# Patient Record
Sex: Male | Born: 1960
Health system: Southern US, Community
[De-identification: ages and names within clinical notes are randomized; demographics above are authoritative.]

## PROBLEM LIST (undated history)

## (undated) DIAGNOSIS — J449 Chronic obstructive pulmonary disease, unspecified: Secondary | ICD-10-CM

## (undated) DIAGNOSIS — I10 Essential (primary) hypertension: Secondary | ICD-10-CM

---

## 1998-11-25 ENCOUNTER — Emergency Department (HOSPITAL_COMMUNITY): Admission: EM | Admit: 1998-11-25 | Discharge: 1998-11-25 | Payer: Self-pay | Admitting: Emergency Medicine

## 2003-05-12 ENCOUNTER — Emergency Department (HOSPITAL_COMMUNITY): Admission: EM | Admit: 2003-05-12 | Discharge: 2003-05-13 | Payer: Self-pay | Admitting: *Deleted

## 2003-05-12 ENCOUNTER — Encounter: Payer: Self-pay | Admitting: *Deleted

## 2010-12-13 ENCOUNTER — Emergency Department (HOSPITAL_COMMUNITY)
Admission: EM | Admit: 2010-12-13 | Discharge: 2010-12-13 | Payer: Self-pay | Source: Home / Self Care | Admitting: Emergency Medicine

## 2011-04-09 ENCOUNTER — Emergency Department (HOSPITAL_COMMUNITY): Payer: Self-pay

## 2011-04-09 ENCOUNTER — Emergency Department (HOSPITAL_COMMUNITY)
Admission: EM | Admit: 2011-04-09 | Discharge: 2011-04-09 | Disposition: A | Discharge status: Home / Self Care | Payer: Self-pay | Attending: Emergency Medicine | Admitting: Emergency Medicine

## 2011-04-09 DIAGNOSIS — M19079 Primary osteoarthritis, unspecified ankle and foot: Secondary | ICD-10-CM | POA: Insufficient documentation

## 2011-04-09 DIAGNOSIS — Z79899 Other long term (current) drug therapy: Secondary | ICD-10-CM | POA: Insufficient documentation

## 2011-06-13 ENCOUNTER — Emergency Department (HOSPITAL_COMMUNITY)
Admission: EM | Admit: 2011-06-13 | Discharge: 2011-06-14 | Disposition: A | Discharge status: Home / Self Care | Payer: Self-pay | Attending: Emergency Medicine | Admitting: Emergency Medicine

## 2011-06-13 ENCOUNTER — Encounter: Payer: Self-pay | Admitting: *Deleted

## 2011-06-13 DIAGNOSIS — M722 Plantar fascial fibromatosis: Secondary | ICD-10-CM | POA: Insufficient documentation

## 2011-06-13 DIAGNOSIS — S93409A Sprain of unspecified ligament of unspecified ankle, initial encounter: Secondary | ICD-10-CM | POA: Insufficient documentation

## 2011-06-13 DIAGNOSIS — F17200 Nicotine dependence, unspecified, uncomplicated: Secondary | ICD-10-CM | POA: Insufficient documentation

## 2011-06-13 DIAGNOSIS — X58XXXA Exposure to other specified factors, initial encounter: Secondary | ICD-10-CM | POA: Insufficient documentation

## 2011-06-13 DIAGNOSIS — M79609 Pain in unspecified limb: Secondary | ICD-10-CM | POA: Insufficient documentation

## 2011-06-13 MED ORDER — PREDNISONE 20 MG PO TABS
60.0000 mg | ORAL_TABLET | Freq: Once | ORAL | Status: AC
  Administered 2011-06-13: 60 mg via ORAL
  Filled 2011-06-13: qty 3

## 2011-06-13 NOTE — ED Notes (Signed)
Pt requesting tylenol for pain

## 2011-06-13 NOTE — ED Notes (Signed)
Pt c/o left foot pain x 1 month.

## 2011-06-14 DIAGNOSIS — M722 Plantar fascial fibromatosis: Secondary | ICD-10-CM

## 2011-06-14 NOTE — ED Notes (Signed)
Pt ready for discharge

## 2011-06-14 NOTE — ED Notes (Signed)
Pt waiting for d/c papers 

## 2011-06-14 NOTE — ED Provider Notes (Signed)
History     Chief Complaint  Patient presents with  . Foot Pain   Patient is a 50 y.o. male presenting with lower extremity pain.  Foot Pain This is a new problem. Episode onset: ~ 2 weeks ago. The problem has been gradually worsening. The symptoms are aggravated by standing and walking. He has tried acetaminophen for the symptoms.  Foot Pain This is a new problem. Episode onset: ~ 2 weeks ago. The problem has been gradually worsening. The symptoms are aggravated by standing and walking. He has tried acetaminophen for the symptoms.    History reviewed. No pertinent past medical history.  History reviewed. No pertinent past surgical history.  History reviewed. No pertinent family history.  History  Substance Use Topics  . Smoking status: Current Some Day Smoker  . Smokeless tobacco: Not on file  . Alcohol Use: No      Review of Systems  Musculoskeletal:       Pain below L lateral malleolus.  Pain to plantar L foot.  All other systems reviewed and are negative.    Physical Exam  BP 170/89  Pulse 68  Temp(Src) 97.7 F (36.5 C) (Oral)  Resp 20  Ht 5\' 6"  (1.676 m)  Wt 170 lb (77.111 kg)  BMI 27.44 kg/m2  SpO2 98%  Physical Exam  Nursing note and vitals reviewed. Constitutional: He is oriented to person, place, and time. Vital signs are normal. He appears well-developed and well-nourished.  HENT:  Head: Normocephalic and atraumatic.  Right Ear: External ear normal.  Left Ear: External ear normal.  Nose: Nose normal.  Mouth/Throat: No oropharyngeal exudate.  Eyes: Conjunctivae and EOM are normal. Pupils are equal, round, and reactive to light. Right eye exhibits no discharge. Left eye exhibits no discharge. No scleral icterus.  Neck: Normal range of motion. Neck supple. No JVD present. No tracheal deviation present. No thyromegaly present.  Cardiovascular: Normal rate, regular rhythm, normal heart sounds, intact distal pulses and normal pulses.  Exam reveals no  gallop and no friction rub.   No murmur heard. Pulmonary/Chest: Effort normal and breath sounds normal. No stridor. No respiratory distress. He has no wheezes. He has no rales. He exhibits no tenderness.  Abdominal: Soft. Normal appearance and bowel sounds are normal. He exhibits no distension and no mass. There is no tenderness. There is no rebound and no guarding.  Musculoskeletal: He exhibits tenderness. He exhibits no edema.       Right ankle: He exhibits normal range of motion, no swelling, no ecchymosis, no deformity and normal pulse. tenderness.       Feet:  Lymphadenopathy:    He has no cervical adenopathy.  Neurological: He is alert and oriented to person, place, and time. He has normal reflexes. No cranial nerve deficit. Coordination normal. GCS eye subscore is 4. GCS verbal subscore is 5. GCS motor subscore is 6.  Reflex Scores:      Tricep reflexes are 2+ on the right side and 2+ on the left side.      Bicep reflexes are 2+ on the right side and 2+ on the left side.      Brachioradialis reflexes are 2+ on the right side and 2+ on the left side.      Patellar reflexes are 2+ on the right side and 2+ on the left side.      Achilles reflexes are 2+ on the right side and 2+ on the left side. Skin: Skin is warm and dry. No rash  noted. He is not diaphoretic.  Psychiatric: He has a normal mood and affect. His speech is normal and behavior is normal. Judgment and thought content normal. Cognition and memory are normal.    ED Course  Procedures  MDM No acute distress.      Worthy Rancher, PA 06/14/11 0018  Worthy Rancher, PA 06/14/11 0019  Worthy Rancher, PA 06/14/11 0025  Medical screening examination/treatment/procedure(s) were performed by non-physician practitioner and as supervising physician I was immediately available for consultation/collaboration.  Nicoletta Dress. Colon Branch, MD 06/14/11 [phone]

## 2011-06-22 ENCOUNTER — Emergency Department (HOSPITAL_COMMUNITY): Payer: Self-pay

## 2011-06-22 ENCOUNTER — Emergency Department (HOSPITAL_COMMUNITY)
Admission: EM | Admit: 2011-06-22 | Discharge: 2011-06-22 | Disposition: A | Discharge status: Home / Self Care | Payer: Self-pay | Attending: Emergency Medicine | Admitting: Emergency Medicine

## 2011-06-22 ENCOUNTER — Encounter (HOSPITAL_COMMUNITY): Payer: Self-pay | Admitting: Emergency Medicine

## 2011-06-22 DIAGNOSIS — S81009A Unspecified open wound, unspecified knee, initial encounter: Secondary | ICD-10-CM | POA: Insufficient documentation

## 2011-06-22 DIAGNOSIS — F17200 Nicotine dependence, unspecified, uncomplicated: Secondary | ICD-10-CM | POA: Insufficient documentation

## 2011-06-22 DIAGNOSIS — S8012XA Contusion of left lower leg, initial encounter: Secondary | ICD-10-CM

## 2011-06-22 DIAGNOSIS — I10 Essential (primary) hypertension: Secondary | ICD-10-CM | POA: Insufficient documentation

## 2011-06-22 DIAGNOSIS — S81812A Laceration without foreign body, left lower leg, initial encounter: Secondary | ICD-10-CM

## 2011-06-22 DIAGNOSIS — S91009A Unspecified open wound, unspecified ankle, initial encounter: Secondary | ICD-10-CM | POA: Insufficient documentation

## 2011-06-22 DIAGNOSIS — S8010XA Contusion of unspecified lower leg, initial encounter: Secondary | ICD-10-CM | POA: Insufficient documentation

## 2011-06-22 HISTORY — DX: Essential (primary) hypertension: I10

## 2011-06-22 MED ORDER — OXYCODONE-ACETAMINOPHEN 5-325 MG PO TABS: 1.0000 | ORAL_TABLET | ORAL | Status: AC | PRN

## 2011-06-22 MED ORDER — MORPHINE SULFATE 4 MG/ML IJ SOLN
4.0000 mg | Freq: Once | INTRAMUSCULAR | Status: AC
  Administered 2011-06-22: 4 mg via INTRAVENOUS
  Filled 2011-06-22: qty 1

## 2011-06-22 MED ORDER — TETANUS-DIPHTH-ACELL PERTUSSIS 5-2.5-18.5 LF-MCG/0.5 IM SUSP
0.5000 mL | Freq: Once | INTRAMUSCULAR | Status: AC
  Administered 2011-06-22: 0.5 mL via INTRAMUSCULAR
  Filled 2011-06-22: qty 0.5

## 2011-06-22 MED ORDER — LIDOCAINE HCL (PF) 1 % IJ SOLN
INTRAMUSCULAR | Status: AC
  Administered 2011-06-22: 20:00:00
  Filled 2011-06-22: qty 10

## 2011-06-22 MED ORDER — BACITRACIN-NEOMYCIN-POLYMYXIN 400-5-5000 EX OINT
TOPICAL_OINTMENT | CUTANEOUS | Status: AC
  Administered 2011-06-22: 20:00:00
  Filled 2011-06-22: qty 6

## 2011-06-22 MED ORDER — IBUPROFEN 800 MG PO TABS: 800.0000 mg | ORAL_TABLET | Freq: Three times a day (TID) | ORAL | Status: AC

## 2011-06-22 NOTE — ED Notes (Signed)
EDP at bedside.  Wound irrigated, sutured.  Pt tolerated.  Wound cleaned and dressed with antibiotic ointment and gauze dressing.
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
## 2011-06-22 NOTE — ED Notes (Signed)
IV established, pt medicated. Suture cart at bedside. House supervisor notified of need for power irrigator. NAD at this time. Pt stable.

## 2011-06-24 ENCOUNTER — Emergency Department (HOSPITAL_COMMUNITY): Payer: Self-pay

## 2011-06-24 ENCOUNTER — Emergency Department (HOSPITAL_COMMUNITY)
Admission: EM | Admit: 2011-06-24 | Discharge: 2011-06-24 | Disposition: A | Discharge status: Home / Self Care | Payer: Self-pay | Attending: Emergency Medicine | Admitting: Emergency Medicine

## 2011-06-24 ENCOUNTER — Encounter (HOSPITAL_COMMUNITY): Payer: Self-pay | Admitting: Emergency Medicine

## 2011-06-24 DIAGNOSIS — Z5189 Encounter for other specified aftercare: Secondary | ICD-10-CM | POA: Insufficient documentation

## 2011-06-24 DIAGNOSIS — M25579 Pain in unspecified ankle and joints of unspecified foot: Secondary | ICD-10-CM | POA: Insufficient documentation

## 2011-06-24 DIAGNOSIS — S81802A Unspecified open wound, left lower leg, initial encounter: Secondary | ICD-10-CM

## 2011-06-24 MED ORDER — BACITRACIN-NEOMYCIN-POLYMYXIN 400-5-5000 EX OINT: TOPICAL_OINTMENT | Freq: Once | CUTANEOUS | Status: DC

## 2011-06-24 MED ORDER — AMOXICILLIN 500 MG PO CAPS: ORAL_CAPSULE | ORAL | Status: DC

## 2011-06-24 MED ORDER — IBUPROFEN 800 MG PO TABS
800.0000 mg | ORAL_TABLET | Freq: Once | ORAL | Status: AC
  Administered 2011-06-24: 800 mg via ORAL
  Filled 2011-06-24: qty 1

## 2011-06-24 MED ORDER — BACITRACIN ZINC 500 UNIT/GM EX OINT
TOPICAL_OINTMENT | CUTANEOUS | Status: AC
  Filled 2011-06-24: qty 1.8

## 2011-06-24 MED ORDER — CEFTRIAXONE SODIUM 1 G IJ SOLR
1.0000 g | Freq: Once | INTRAMUSCULAR | Status: AC
  Administered 2011-06-24: 1 g via INTRAMUSCULAR
  Filled 2011-06-24: qty 1

## 2011-06-24 MED ORDER — DOUBLE ANTIBIOTIC 500-10000 UNIT/GM EX OINT
TOPICAL_OINTMENT | Freq: Once | CUTANEOUS | Status: AC
  Administered 2011-06-24: 19:00:00 via TOPICAL

## 2011-06-24 MED ORDER — LIDOCAINE HCL (PF) 1 % IJ SOLN
INTRAMUSCULAR | Status: AC
  Filled 2011-06-24: qty 5

## 2011-06-24 MED ORDER — OXYCODONE-ACETAMINOPHEN 5-325 MG PO TABS
1.0000 | ORAL_TABLET | Freq: Once | ORAL | Status: AC
  Administered 2011-06-24: 1 via ORAL
  Filled 2011-06-24: qty 1

## 2011-06-24 MED ORDER — LIDOCAINE HCL (PF) 1 % IJ SOLN: 2.0000 mL | Freq: Once | INTRAMUSCULAR | Status: DC

## 2011-06-24 NOTE — ED Notes (Signed)
Pt seen here on Sat for L leg laceration. Pt reports increased edema and pain. Pt reports bleeding from wound site.

## 2011-06-24 NOTE — ED Notes (Signed)
Left in c/o spouse for transport home; a&ox4; in no distress 

## 2011-06-24 NOTE — ED Provider Notes (Signed)
History     CSN: [id number] Arrival date & time: 06/24/2011  4:51 PM  Chief Complaint  Patient presents with  . Wound Check   Patient is a 50 y.o. male presenting with wound check. The history is provided by the patient and the spouse.  Wound Check  He was treated in the ED 2 to 3 days ago. Previous treatment in the ED includes laceration repair. Treatments since wound repair include a wound recheck. There has been no drainage from the wound. The redness has not changed. The swelling has worsened. The pain has worsened. There is difficulty moving the extremity or digit due to pain.    Past Medical History  Diagnosis Date  . Hypertension     History reviewed. No pertinent past surgical history.  Family History  Problem Relation Age of Onset  . Hypertension Mother   . Diabetes Mother   . Diabetes Other     History  Substance Use Topics  . Smoking status: Current Some Day Smoker -- 1.0 packs/day for 20 years    Types: Cigarettes  . Smokeless tobacco: Never Used  . Alcohol Use: No      Review of Systems  Constitutional: Negative for activity change.       All ROS Neg except as noted in HPI  HENT: Negative for nosebleeds and neck pain.   Eyes: Negative for photophobia and discharge.  Respiratory: Negative for cough, shortness of breath and wheezing.   Cardiovascular: Negative for chest pain and palpitations.  Gastrointestinal: Negative for abdominal pain and blood in stool.  Genitourinary: Negative for dysuria, frequency and hematuria.  Musculoskeletal: Negative for back pain and arthralgias.  Skin: Negative.   Neurological: Negative for dizziness, seizures and speech difficulty.  Psychiatric/Behavioral: Negative for hallucinations and confusion.    Physical Exam  BP 140/86  Pulse 77  Temp(Src) 98.3 F (36.8 C) (Oral)  Resp 16  Ht 5\' 6"  (1.676 m)  Wt 170 lb (77.111 kg)  BMI 27.44 kg/m2  SpO2 97%  Physical Exam  Nursing note and vitals  reviewed. Constitutional: He is oriented to person, place, and time. He appears well-developed and well-nourished.  Non-toxic appearance.  HENT:  Head: Normocephalic.  Right Ear: Tympanic membrane and external ear normal.  Left Ear: Tympanic membrane and external ear normal.  Eyes: EOM and lids are normal. Pupils are equal, round, and reactive to light.  Neck: Normal range of motion. Neck supple. Carotid bruit is not present.  Cardiovascular: Normal rate, regular rhythm, normal heart sounds, intact distal pulses and normal pulses.   Pulmonary/Chest: Breath sounds normal. No respiratory distress.  Abdominal: Soft. Bowel sounds are normal. There is no tenderness. There is no guarding.  Musculoskeletal: Normal range of motion.       Mild increase warmness of the laceration site. No red streaks. Swelling from laceration site to the ankle. Pain With ROM of the ankle on the left.   Lymphadenopathy:       Head (right side): No submandibular adenopathy present.       Head (left side): No submandibular adenopathy present.    He has no cervical adenopathy.  Neurological: He is alert and oriented to person, place, and time. He has normal strength. No cranial nerve deficit or sensory deficit.  Skin: Skin is warm and dry.  Psychiatric: He has a normal mood and affect. His speech is normal.    ED Course  Procedures  MDM I have reviewed nursing notes, vital signs, and all appropriate lab  and imaging results for this patient.      Kathie Dike, [place] 06/24/11 [phone]

## 2011-06-24 NOTE — ED Notes (Signed)
Wound to LLE dressed with bacitracin, telfa and kling

## 2011-06-25 NOTE — ED Provider Notes (Signed)
Medical screening examination/treatment/procedure(s) were performed by non-physician practitioner and as supervising physician I was immediately available for consultation/collaboration.  Kendel Pesnell, MD 06/25/11 0016 

## 2017-05-14 ENCOUNTER — Emergency Department (HOSPITAL_COMMUNITY)
Admission: EM | Admit: 2017-05-14 | Discharge: 2017-05-14 | Disposition: A | Discharge status: Home / Self Care | Payer: Self-pay | Attending: Emergency Medicine | Admitting: Emergency Medicine

## 2017-05-14 ENCOUNTER — Encounter (HOSPITAL_COMMUNITY): Payer: Self-pay | Admitting: Cardiology

## 2017-05-14 ENCOUNTER — Emergency Department (HOSPITAL_COMMUNITY): Payer: Self-pay

## 2017-05-14 DIAGNOSIS — S91331D Puncture wound without foreign body, right foot, subsequent encounter: Secondary | ICD-10-CM | POA: Insufficient documentation

## 2017-05-14 DIAGNOSIS — L03115 Cellulitis of right lower limb: Secondary | ICD-10-CM | POA: Insufficient documentation

## 2017-05-14 DIAGNOSIS — I10 Essential (primary) hypertension: Secondary | ICD-10-CM | POA: Insufficient documentation

## 2017-05-14 DIAGNOSIS — W450XXA Nail entering through skin, initial encounter: Secondary | ICD-10-CM | POA: Insufficient documentation

## 2017-05-14 DIAGNOSIS — F17210 Nicotine dependence, cigarettes, uncomplicated: Secondary | ICD-10-CM | POA: Insufficient documentation

## 2017-05-14 LAB — COMPREHENSIVE METABOLIC PANEL
ALT: 20 U/L (ref 17–63)
ANION GAP: 7 (ref 5–15)
AST: 22 U/L (ref 15–41)
Albumin: 3.7 g/dL (ref 3.5–5.0)
Alkaline Phosphatase: 77 U/L (ref 38–126)
BILIRUBIN TOTAL: 0.4 mg/dL (ref 0.3–1.2)
BUN: 13 mg/dL (ref 6–20)
CO2: 25 mmol/L (ref 22–32)
Calcium: 8.8 mg/dL — ABNORMAL LOW (ref 8.9–10.3)
Chloride: 104 mmol/L (ref 101–111)
Creatinine, Ser: 0.84 mg/dL (ref 0.61–1.24)
Glucose, Bld: 125 mg/dL — ABNORMAL HIGH (ref 65–99)
POTASSIUM: 4.1 mmol/L (ref 3.5–5.1)
Sodium: 136 mmol/L (ref 135–145)
TOTAL PROTEIN: 6.8 g/dL (ref 6.5–8.1)

## 2017-05-14 LAB — CBC WITH DIFFERENTIAL/PLATELET
Basophils Absolute: 0 10*3/uL (ref 0.0–0.1)
Basophils Relative: 0 %
Eosinophils Absolute: 0.2 10*3/uL (ref 0.0–0.7)
Eosinophils Relative: 2 %
HEMATOCRIT: 43.8 % (ref 39.0–52.0)
Hemoglobin: 14.8 g/dL (ref 13.0–17.0)
LYMPHS PCT: 26 %
Lymphs Abs: 2.4 10*3/uL (ref 0.7–4.0)
MCH: 30.8 pg (ref 26.0–34.0)
MCHC: 33.8 g/dL (ref 30.0–36.0)
MCV: 91.1 fL (ref 78.0–100.0)
MONO ABS: 1 10*3/uL (ref 0.1–1.0)
MONOS PCT: 11 %
Neutro Abs: 5.6 10*3/uL (ref 1.7–7.7)
Neutrophils Relative %: 61 %
Platelets: 207 10*3/uL (ref 150–400)
RBC: 4.81 MIL/uL (ref 4.22–5.81)
RDW: 12.6 % (ref 11.5–15.5)
WBC: 9.3 10*3/uL (ref 4.0–10.5)

## 2017-05-14 MED ORDER — TRAMADOL HCL 50 MG PO TABS: 50.0000 mg | ORAL_TABLET | Freq: Four times a day (QID) | ORAL | 0 refills | Status: DC | PRN

## 2017-05-14 MED ORDER — CIPROFLOXACIN HCL 500 MG PO TABS: ORAL_TABLET | ORAL | 0 refills | Status: DC

## 2017-05-14 MED ORDER — CIPROFLOXACIN IN D5W 400 MG/200ML IV SOLN
400.0000 mg | Freq: Once | INTRAVENOUS | Status: AC
  Administered 2017-05-14: 400 mg via INTRAVENOUS
  Filled 2017-05-14: qty 200

## 2017-05-14 NOTE — Discharge Instructions (Signed)
Cleaned the foot good with soap and water at least once a day. Return in 2-3 days for recheck. Return tomorrow if getting worse. Start taking her antibiotic today

## 2017-05-14 NOTE — ED Triage Notes (Signed)
Stepped on 2 nails Monday with right foot.  Had tetanus shot Tuesday.  Now right foot swollen and red.

## 2017-05-14 NOTE — ED Notes (Signed)
Waiting for pt.'s antibiotics to finish.

## 2017-05-14 NOTE — ED Provider Notes (Signed)
[facility]EMER[facility]ENCY DEPT Provider Note   CSN: [id number] Arrival date & time: 05/14/17  1191     History   Chief Complaint Chief Complaint  Patient presents with  . Cellulitis    HPI Nathan Rhodes is a 56 y.o. male.  Patient states that he stepped on a nail through his boot on Monday. Patient did go get a tetanus shot. Patient states now he is having pain in that right foot swelling or redness   The history is provided by the patient.  Foot Pain  This is a new problem. The current episode started 1 to 2 hours ago. The problem occurs constantly. The problem has not changed since onset.Pertinent negatives include no chest pain, no abdominal pain and no headaches. Exacerbated by: Movement. Nothing relieves the symptoms. He has tried nothing for the symptoms. The treatment provided no relief.    Past Medical History:  Diagnosis Date  . Hypertension     There are no active problems to display for this patient.   History reviewed. No pertinent surgical history.     Home Medications    Prior to Admission medications   Medication Sig Start Date End Date Taking? Authorizing Provider  acetaminophen (TYLENOL) 500 MG tablet Take 500 mg by mouth every 4 (four) hours as needed. For foot pain    [provider]  amoxicillin (AMOXIL) 500 MG capsule 2 po bid with food 06/24/11   Ivery Quale, PA-C  ciprofloxacin (CIPRO) 500 MG tablet One po bid 05/14/17   Bethann Berkshire, MD  traMADol (ULTRAM) 50 MG tablet Take 1 tablet (50 mg total) by mouth every 6 (six) hours as needed. 05/14/17   Bethann Berkshire, MD    Family History Family History  Problem Relation Age of Onset  . Hypertension Mother   . Diabetes Mother   . Diabetes Other     Social History Social History  Substance Use Topics  . Smoking status: Current Some Day Smoker    Packs/day: 1.00    Years: 20.00    Types: Cigarettes  . Smokeless tobacco: Never Used  . Alcohol use No     Allergies   Tylenol  [acetaminophen]   Review of Systems Review of Systems  Constitutional: Negative for appetite change and fatigue.  HENT: Negative for congestion, ear discharge and sinus pressure.   Eyes: Negative for discharge.  Respiratory: Negative for cough.   Cardiovascular: Negative for chest pain.  Gastrointestinal: Negative for abdominal pain and diarrhea.  Genitourinary: Negative for frequency and hematuria.  Musculoskeletal: Negative for back pain.       Swelling and pain right foot  Skin: Negative for rash.  Neurological: Negative for seizures and headaches.  Psychiatric/Behavioral: Negative for hallucinations.     Physical Exam Updated Vital Signs BP (!) 168/83 (BP Location: Right Arm)   Pulse (!) 59   Temp 97.9 F (36.6 C) (Oral)   Resp 16   Ht 5\' 6"  (1.676 m)   Wt 68 kg (150 lb)   SpO2 96%   BMI 24.21 kg/m   Physical Exam  Constitutional: He is oriented to person, place, and time. He appears well-developed.  HENT:  Head: Normocephalic.  Eyes: Conjunctivae and EOM are normal. No scleral icterus.  Neck: Neck supple. No thyromegaly present.  Cardiovascular: Normal rate and regular rhythm.  Exam reveals no gallop and no friction rub.   No murmur heard. Pulmonary/Chest: No stridor. He has no wheezes. He has no rales. He exhibits no tenderness.  Abdominal: He  exhibits no distension. There is no tenderness. There is no rebound.  Musculoskeletal: Normal range of motion. He exhibits no edema.  Patient has 2 puncture wounds on the bottom of the foot on the ball of foot. Also has swelling tenderness or redness on the distal half of this foot.  Lymphadenopathy:    He has no cervical adenopathy.  Neurological: He is oriented to person, place, and time. He exhibits normal muscle tone. Coordination normal.  Skin: No rash noted. No erythema.  Psychiatric: He has a normal mood and affect. His behavior is normal.     ED Treatments / Results  Labs (all labs ordered are listed, but  only abnormal results are displayed) Labs Reviewed  COMPREHENSIVE METABOLIC PANEL - Abnormal; Notable for the following:       Result Value   Glucose, Bld 125 (*)    Calcium 8.8 (*)    All other components within normal limits  CBC WITH DIFFERENTIAL/PLATELET    EKG  EKG Interpretation None       Radiology Dg Foot Complete Right  Result Date: 05/14/2017 CLINICAL DATA:  Pain.  Stepped on a rusty nail Monday EXAM: RIGHT FOOT COMPLETE - 3+ VIEW COMPARISON:  None. FINDINGS: No acute bony abnormality. Specifically, no fracture, subluxation, or dislocation. Soft tissues are intact. Joint spaces maintained. No radiopaque foreign bodies. IMPRESSION: Negative. Electronically Signed   By: Charlett NoseKevin  Dover M.D.   On: 05/14/2017 10:10    Procedures Procedures (including critical care time)  Medications Ordered in ED Medications  ciprofloxacin (CIPRO) IVPB 400 mg (400 mg Intravenous New Bag/Given 05/14/17 1008)     Initial Impression / Assessment and Plan / ED Course  I have reviewed the triage vital signs and the nursing notes.  Pertinent labs & imaging results that were available during my care of the patient were reviewed by me and considered in my medical decision making (see chart for details).     Patient had cellulitis to his right foot. She is given a dose IV antibiotics and will be treated with Cipro by mouth. Patient is instructed to come back in 2-3 days for recheck. He was also told if he is worse tomorrow to come back tomorrow  Final Clinical Impressions(s) / ED Diagnoses   Final diagnoses:  Cellulitis of right foot    New Prescriptions New Prescriptions   CIPROFLOXACIN (CIPRO) 500 MG TABLET    One po bid   TRAMADOL (ULTRAM) 50 MG TABLET    Take 1 tablet (50 mg total) by mouth every 6 (six) hours as needed.     Bethann BerkshireZammit, Jentry Warnell, MD 05/14/17 1155

## 2017-05-14 NOTE — ED Notes (Signed)
Pt is complaining of pain in foot and is requesting pain medication

## 2018-07-17 ENCOUNTER — Emergency Department (HOSPITAL_COMMUNITY): Payer: Self-pay

## 2018-07-17 ENCOUNTER — Emergency Department (HOSPITAL_COMMUNITY)
Admission: EM | Admit: 2018-07-17 | Discharge: 2018-07-17 | Disposition: A | Discharge status: Home / Self Care | Payer: Self-pay | Attending: Emergency Medicine | Admitting: Emergency Medicine

## 2018-07-17 ENCOUNTER — Encounter (HOSPITAL_COMMUNITY): Payer: Self-pay

## 2018-07-17 DIAGNOSIS — T1490XA Injury, unspecified, initial encounter: Secondary | ICD-10-CM

## 2018-07-17 DIAGNOSIS — M549 Dorsalgia, unspecified: Secondary | ICD-10-CM | POA: Insufficient documentation

## 2018-07-17 DIAGNOSIS — W132XXA Fall from, out of or through roof, initial encounter: Secondary | ICD-10-CM | POA: Insufficient documentation

## 2018-07-17 DIAGNOSIS — F17210 Nicotine dependence, cigarettes, uncomplicated: Secondary | ICD-10-CM | POA: Insufficient documentation

## 2018-07-17 DIAGNOSIS — S0990XA Unspecified injury of head, initial encounter: Secondary | ICD-10-CM | POA: Insufficient documentation

## 2018-07-17 DIAGNOSIS — Y9289 Other specified places as the place of occurrence of the external cause: Secondary | ICD-10-CM | POA: Insufficient documentation

## 2018-07-17 DIAGNOSIS — R0789 Other chest pain: Secondary | ICD-10-CM | POA: Insufficient documentation

## 2018-07-17 DIAGNOSIS — Y9389 Activity, other specified: Secondary | ICD-10-CM | POA: Insufficient documentation

## 2018-07-17 DIAGNOSIS — I10 Essential (primary) hypertension: Secondary | ICD-10-CM | POA: Insufficient documentation

## 2018-07-17 DIAGNOSIS — S139XXA Sprain of joints and ligaments of unspecified parts of neck, initial encounter: Secondary | ICD-10-CM | POA: Insufficient documentation

## 2018-07-17 DIAGNOSIS — W19XXXA Unspecified fall, initial encounter: Secondary | ICD-10-CM

## 2018-07-17 DIAGNOSIS — Y998 Other external cause status: Secondary | ICD-10-CM | POA: Insufficient documentation

## 2018-07-17 LAB — COMPREHENSIVE METABOLIC PANEL
ALT: 19 U/L (ref 0–44)
AST: 30 U/L (ref 15–41)
Albumin: 3.9 g/dL (ref 3.5–5.0)
Alkaline Phosphatase: 79 U/L (ref 38–126)
Anion gap: 9 (ref 5–15)
BILIRUBIN TOTAL: 1.1 mg/dL (ref 0.3–1.2)
BUN: 8 mg/dL (ref 6–20)
CO2: 22 mmol/L (ref 22–32)
CREATININE: 0.79 mg/dL (ref 0.61–1.24)
Calcium: 8.8 mg/dL — ABNORMAL LOW (ref 8.9–10.3)
Chloride: 106 mmol/L (ref 98–111)
Glucose, Bld: 100 mg/dL — ABNORMAL HIGH (ref 70–99)
Potassium: 3.4 mmol/L — ABNORMAL LOW (ref 3.5–5.1)
Sodium: 137 mmol/L (ref 135–145)
TOTAL PROTEIN: 6.5 g/dL (ref 6.5–8.1)

## 2018-07-17 LAB — URINALYSIS, ROUTINE W REFLEX MICROSCOPIC
BILIRUBIN URINE: NEGATIVE
GLUCOSE, UA: NEGATIVE mg/dL
HGB URINE DIPSTICK: NEGATIVE
Ketones, ur: 20 mg/dL — AB
Leukocytes, UA: NEGATIVE
Nitrite: NEGATIVE
PH: 5 (ref 5.0–8.0)
Protein, ur: NEGATIVE mg/dL
Specific Gravity, Urine: >1.046 — ABNORMAL HIGH (ref 1.005–1.030)

## 2018-07-17 LAB — CBC
HCT: 47 % (ref 39.0–52.0)
Hemoglobin: 15.6 g/dL (ref 13.0–17.0)
MCH: 30.5 pg (ref 26.0–34.0)
MCHC: 33.2 g/dL (ref 30.0–36.0)
MCV: 91.8 fL (ref 78.0–100.0)
PLATELETS: 196 10*3/uL (ref 150–400)
RBC: 5.12 MIL/uL (ref 4.22–5.81)
RDW: 12 % (ref 11.5–15.5)
WBC: 10.4 10*3/uL (ref 4.0–10.5)

## 2018-07-17 LAB — I-STAT CHEM 8, ED
BUN: 9 mg/dL (ref 6–20)
CREATININE: 0.7 mg/dL (ref 0.61–1.24)
Calcium, Ion: 1.06 mmol/L — ABNORMAL LOW (ref 1.15–1.40)
Chloride: 105 mmol/L (ref 98–111)
GLUCOSE: 99 mg/dL (ref 70–99)
HEMATOCRIT: 46 % (ref 39.0–52.0)
Hemoglobin: 15.6 g/dL (ref 13.0–17.0)
Potassium: 3.4 mmol/L — ABNORMAL LOW (ref 3.5–5.1)
Sodium: 137 mmol/L (ref 135–145)
TCO2: 22 mmol/L (ref 22–32)

## 2018-07-17 MED ORDER — IBUPROFEN 800 MG PO TABS: 800.0000 mg | ORAL_TABLET | Freq: Three times a day (TID) | ORAL | 0 refills | Status: DC | PRN

## 2018-07-17 MED ORDER — IOPAMIDOL (ISOVUE-300) INJECTION 61%
100.0000 mL | Freq: Once | INTRAVENOUS | Status: AC | PRN
  Administered 2018-07-17: 100 mL via INTRAVENOUS

## 2018-07-17 MED ORDER — OXYCODONE HCL 5 MG PO TABS
10.0000 mg | ORAL_TABLET | Freq: Once | ORAL | Status: AC
  Administered 2018-07-17: 10 mg via ORAL
  Filled 2018-07-17: qty 2

## 2018-07-17 MED ORDER — FENTANYL CITRATE (PF) 100 MCG/2ML IJ SOLN
50.0000 ug | Freq: Once | INTRAMUSCULAR | Status: AC
  Administered 2018-07-17: 50 ug via INTRAVENOUS
  Filled 2018-07-17: qty 2

## 2018-07-17 MED ORDER — OXYCODONE HCL 5 MG PO TABS: 5.0000 mg | ORAL_TABLET | ORAL | 0 refills | Status: AC | PRN

## 2018-07-17 MED ORDER — IOPAMIDOL (ISOVUE-300) INJECTION 61%
INTRAVENOUS | Status: AC
  Filled 2018-07-17: qty 100
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
## 2018-07-17 NOTE — ED Notes (Signed)
Called CT to get a status update.

## 2018-07-17 NOTE — ED Notes (Signed)
Patient transported to CT 

## 2018-07-17 NOTE — ED Notes (Signed)
Pt alert and oriented in NAD. Pt verbalized understanding of discharge instructions. 

## 2018-07-17 NOTE — Discharge Instructions (Addendum)
Your evaluated in the emergency department for head neck and chest pain after a fall.  You had multiple CAT scans that did not show any obvious injuries.  We are prescribing you ibuprofen and oxycodone to use as needed for pain.  Please follow-up with your doctor and return if any worsening symptoms.

## 2018-07-17 NOTE — ED Provider Notes (Signed)
[facility] EMERGENCY DEPARTMENT Provider Note   CSN: [id number] Arrival date & time: 07/17/18  1229     History   Chief Complaint Chief Complaint  Patient presents with  . Fall FROM ROOF    HPI Nathan Rhodes is a 57 y.o. male.  He has a history of hypertension and probable undiagnosed COPD.  States he was working about 10 foot up on a roof when he stepped on a board lost his balance and fell down onto the ground struck debris.  He landed mostly in his head and upper back.  No loss of consciousness.  He was assisted to standing and was ambulatory.  He is complaining of 9 out of 10 head neck and upper back pain.  It is increased with deep breathing.  He received some pain medicine by EMS and placed in a collar and he complains of 5 out of 10 pain now.  He had received 100 mcg of fentanyl.  He denies any numbness or weakness no abdominal pain or vomiting.  He denies any extremity injuries.  No alcohol today.  The history is provided by the patient.  Trauma Mechanism of injury: fall Injury location: head/neck and torso Injury location detail: head and back Incident location: outdoors Time since incident: 1 hour Arrived directly from scene: yes   Fall:      Fall occurred: from a roof      Height of fall: 10 foot      Impact surface: dirt (+ debris)      Point of impact: head, neck and back      Entrapped after fall: no  Protective equipment:       None      Suspicion of alcohol use: no      Suspicion of drug use: no  EMS/PTA data:      Ambulatory at scene: yes      Blood loss: none      Responsiveness: alert      Oriented to: person, situation, place and time      Loss of consciousness: no      Amnesic to event: no      Airway interventions: none      Breathing interventions: none      IV access: established      Cardiac interventions: none      Medications administered: fentanyl      Immobilization: C-collar      Airway condition since incident:  stable      Breathing condition since incident: stable      Circulation condition since incident: stable      Mental status condition since incident: stable      Disability condition since incident: stable  Current symptoms:      Pain scale: 5/10      Pain quality: stabbing      Pain timing: constant      Associated symptoms:            Reports back pain (thorasic) and neck pain.            Denies abdominal pain, blindness, chest pain, headache, loss of consciousness, nausea, seizures and vomiting.   Relevant PMH:      Pharmacological risk factors:            No anticoagulation therapy.    Past Medical History:  Diagnosis Date  . Hypertension     There are no active problems to display for this patient.   History  reviewed. No pertinent surgical history.      Home Medications    Prior to Admission medications   Medication Sig Start Date End Date Taking? Authorizing Provider  amoxicillin (AMOXIL) 500 MG capsule 2 po bid with food Patient not taking: Reported on 07/17/2018 06/24/11   Ivery QualeBryant, Hobson, PA-C  ciprofloxacin (CIPRO) 500 MG tablet One po bid Patient not taking: Reported on 07/17/2018 05/14/17   Bethann BerkshireZammit, Joseph, MD  traMADol (ULTRAM) 50 MG tablet Take 1 tablet (50 mg total) by mouth every 6 (six) hours as needed. Patient not taking: Reported on 07/17/2018 05/14/17   Bethann BerkshireZammit, Joseph, MD    Family History Family History  Problem Relation Age of Onset  . Hypertension Mother   . Diabetes Mother   . Diabetes Other     Social History Social History   Tobacco Use  . Smoking status: Current Some Day Smoker    Packs/day: 1.00    Years: 20.00    Pack years: 20.00    Types: Cigarettes  . Smokeless tobacco: Never Used  Substance Use Topics  . Alcohol use: No  . Drug use: No     Allergies   Tylenol [acetaminophen]   Review of Systems Review of Systems  Constitutional: Negative for fever.  HENT: Negative for sore throat.   Eyes: Negative for blindness and  visual disturbance.  Respiratory: Positive for chest tightness and wheezing. Negative for shortness of breath.   Cardiovascular: Negative for chest pain.  Gastrointestinal: Negative for abdominal pain, nausea and vomiting.  Genitourinary: Negative for dysuria.  Musculoskeletal: Positive for back pain (thorasic) and neck pain.  Skin: Negative for rash.  Neurological: Negative for seizures, loss of consciousness and headaches.     Physical Exam Updated Vital Signs BP (!) 167/96 (BP Location: Right Arm)   Pulse 62   Temp 98.2 F (36.8 C) (Oral)   Resp 16   Ht 5\' 6"  (1.676 m)   Wt 74.8 kg   SpO2 97%   BMI 26.63 kg/m   Physical Exam  Constitutional: He appears well-developed and well-nourished.  HENT:  Head: Normocephalic and atraumatic.  Eyes: Conjunctivae are normal.  Neck:  He is in a c-collar with some posterior neck tenderness.  No step-offs.  Trach midline.  Cardiovascular: Normal rate and regular rhythm.  No murmur heard. Pulmonary/Chest: Effort normal. No respiratory distress. He has wheezes. He exhibits tenderness.  Abdominal: Soft. There is no tenderness.  Musculoskeletal: He exhibits tenderness (Tenderness to mid and lower T-spine.  Full range of motion of extremities without any weakness or disability.). He exhibits no edema.  Neurological: He is alert.  Skin: Skin is warm and dry.  Psychiatric: He has a normal mood and affect.  Nursing note and vitals reviewed.    ED Treatments / Results  Labs (all labs ordered are listed, but only abnormal results are displayed) Labs Reviewed  COMPREHENSIVE METABOLIC PANEL - Abnormal; Notable for the following components:      Result Value   Potassium 3.4 (*)    Glucose, Bld 100 (*)    Calcium 8.8 (*)    All other components within normal limits  URINALYSIS, ROUTINE W REFLEX MICROSCOPIC - Abnormal; Notable for the following components:   Specific Gravity, Urine >1.046 (*)    Ketones, ur 20 (*)    All other components  within normal limits  I-STAT CHEM 8, ED - Abnormal; Notable for the following components:   Potassium 3.4 (*)    Calcium, Ion 1.06 (*)  All other components within normal limits  CBC    EKG None  Radiology Ct Head Wo Contrast  Result Date: 07/17/2018 CLINICAL DATA:  Fall from roof, head and neck injury EXAM: CT HEAD WITHOUT CONTRAST CT CERVICAL SPINE WITHOUT CONTRAST TECHNIQUE: Multidetector CT imaging of the head and cervical spine was performed following the standard protocol without intravenous contrast. Multiplanar CT image reconstructions of the cervical spine were also generated. COMPARISON:  None. FINDINGS: CT HEAD FINDINGS Brain: No evidence of acute infarction, hemorrhage, hydrocephalus, extra-axial collection or mass lesion/mass effect. Vascular: No hyperdense vessel or unexpected calcification. Skull: Normal. Negative for fracture or focal lesion. Sinuses/Orbits: Retention cyst or polyp present in the maxillary sinuses bilaterally. Otherwise sinuses clear. Mastoids are clear. Other: None. CT CERVICAL SPINE FINDINGS Alignment: Normal. Skull base and vertebrae: No acute fracture. No primary bone lesion or focal pathologic process. Soft tissues and spinal canal: No prevertebral fluid or swelling. No visible canal hematoma. Disc levels: Minor degenerative spondylosis at C5-6 and C6-7 with disc space narrowing, sclerosis and bony spurring. Degenerative changes of the C1-2 articulation as well. Facets are aligned. No subluxation or dislocation. Upper chest: Negative. Other: None. IMPRESSION: No acute intracranial abnormality by noncontrast CT. Normal head CT without contrast No acute cervical spine fracture or malalignment by CT. Lower cervical degenerative spondylosis as above. Electronically Signed   By: Judie Petit.  Shick M.D.   On: 07/17/2018 15:33   Ct Chest W Contrast  Result Date: 07/17/2018 CLINICAL DATA:  The patient fell 10 ft off roof onto debris, landing on head and twisted neck. Wheezing  91% and placed on 4 lpm 96% . 4/10 after med,pain in neck, sternum and upper back Cut on left hand EXAM: CT CHEST, ABDOMEN, AND PELVIS WITH CONTRAST TECHNIQUE: Multidetector CT imaging of the chest, abdomen and pelvis was performed following the standard protocol during bolus administration of intravenous contrast. CONTRAST:  ISOVUE-300 IOPAMIDOL (ISOVUE-300) INJECTION 61% COMPARISON:  None. FINDINGS: CT CHEST FINDINGS Cardiovascular: Heart size is normal. No pericardial effusion. There is minimal calcification of the thoracic aorta not associated with aneurysm. The pulmonary arteries are normal appearance accounting for contrast bolus timing. Mediastinum/Nodes: The visualized portion of the thyroid gland has a normal appearance. Esophagus is normal in appearance. No mediastinal, hilar, or axillary adenopathy. Lungs/Pleura: No pneumothorax or contusion. No pleural effusions or pulmonary nodules. Musculoskeletal: Minimal midthoracic spondylosis. No acute fracture. CT ABDOMEN PELVIS FINDINGS Hepatobiliary: No hepatic injury or perihepatic hematoma. Gallbladder is unremarkable Pancreas: Unremarkable. No pancreatic ductal dilatation or surrounding inflammatory changes. Spleen: No splenic injury or perisplenic hematoma. Adrenals/Urinary Tract: A small lesion in the RIGHT kidney is too small to characterize. An 8 millimeter lesion in the anterior LOWER pole of the LEFT kidney is indeterminate by Hounsfield unit measurements. There is no hydronephrosis. The ureters are unremarkable. Urinary bladder and visualized urethra are unremarkable. Stomach/Bowel: Stomach and small bowel loops are normal in appearance. The appendix is well seen and has a normal appearance. Numerous colonic diverticula are present. No acute diverticulitis. Vascular/Lymphatic: There is atherosclerotic calcification of the abdominal aorta not associated with aneurysm. No retroperitoneal or mesenteric adenopathy. Reproductive: Prostatic  calcifications. Other: No abdominal wall hernia or abnormality. No abdominopelvic ascites. Musculoskeletal: No acute fracture. IMPRESSION: 1. No acute abnormality of the chest, abdomen, or pelvis. 2.  Aortic atherosclerosis.  (ICD10-I70.0) 3. Normal appendix. 4. No evidence for bowel or solid organ injury. Electronically Signed   By: Norva Pavlov M.D.   On: 07/17/2018 15:57   Ct Cervical  Spine Wo Contrast  Result Date: 07/17/2018 CLINICAL DATA:  Fall from roof, head and neck injury EXAM: CT HEAD WITHOUT CONTRAST CT CERVICAL SPINE WITHOUT CONTRAST TECHNIQUE: Multidetector CT imaging of the head and cervical spine was performed following the standard protocol without intravenous contrast. Multiplanar CT image reconstructions of the cervical spine were also generated. COMPARISON:  None. FINDINGS: CT HEAD FINDINGS Brain: No evidence of acute infarction, hemorrhage, hydrocephalus, extra-axial collection or mass lesion/mass effect. Vascular: No hyperdense vessel or unexpected calcification. Skull: Normal. Negative for fracture or focal lesion. Sinuses/Orbits: Retention cyst or polyp present in the maxillary sinuses bilaterally. Otherwise sinuses clear. Mastoids are clear. Other: None. CT CERVICAL SPINE FINDINGS Alignment: Normal. Skull base and vertebrae: No acute fracture. No primary bone lesion or focal pathologic process. Soft tissues and spinal canal: No prevertebral fluid or swelling. No visible canal hematoma. Disc levels: Minor degenerative spondylosis at C5-6 and C6-7 with disc space narrowing, sclerosis and bony spurring. Degenerative changes of the C1-2 articulation as well. Facets are aligned. No subluxation or dislocation. Upper chest: Negative. Other: None. IMPRESSION: No acute intracranial abnormality by noncontrast CT. Normal head CT without contrast No acute cervical spine fracture or malalignment by CT. Lower cervical degenerative spondylosis as above. Electronically Signed   By: Judie Petit.  Shick M.D.    On: 07/17/2018 15:33   Ct Abdomen Pelvis W Contrast  Result Date: 07/17/2018 CLINICAL DATA:  The patient fell 10 ft off roof onto debris, landing on head and twisted neck. Wheezing 91% and placed on 4 lpm 96% . 4/10 after med,pain in neck, sternum and upper back Cut on left hand EXAM: CT CHEST, ABDOMEN, AND PELVIS WITH CONTRAST TECHNIQUE: Multidetector CT imaging of the chest, abdomen and pelvis was performed following the standard protocol during bolus administration of intravenous contrast. CONTRAST:  ISOVUE-300 IOPAMIDOL (ISOVUE-300) INJECTION 61% COMPARISON:  None. FINDINGS: CT CHEST FINDINGS Cardiovascular: Heart size is normal. No pericardial effusion. There is minimal calcification of the thoracic aorta not associated with aneurysm. The pulmonary arteries are normal appearance accounting for contrast bolus timing. Mediastinum/Nodes: The visualized portion of the thyroid gland has a normal appearance. Esophagus is normal in appearance. No mediastinal, hilar, or axillary adenopathy. Lungs/Pleura: No pneumothorax or contusion. No pleural effusions or pulmonary nodules. Musculoskeletal: Minimal midthoracic spondylosis. No acute fracture. CT ABDOMEN PELVIS FINDINGS Hepatobiliary: No hepatic injury or perihepatic hematoma. Gallbladder is unremarkable Pancreas: Unremarkable. No pancreatic ductal dilatation or surrounding inflammatory changes. Spleen: No splenic injury or perisplenic hematoma. Adrenals/Urinary Tract: A small lesion in the RIGHT kidney is too small to characterize. An 8 millimeter lesion in the anterior LOWER pole of the LEFT kidney is indeterminate by Hounsfield unit measurements. There is no hydronephrosis. The ureters are unremarkable. Urinary bladder and visualized urethra are unremarkable. Stomach/Bowel: Stomach and small bowel loops are normal in appearance. The appendix is well seen and has a normal appearance. Numerous colonic diverticula are present. No acute diverticulitis.  Vascular/Lymphatic: There is atherosclerotic calcification of the abdominal aorta not associated with aneurysm. No retroperitoneal or mesenteric adenopathy. Reproductive: Prostatic calcifications. Other: No abdominal wall hernia or abnormality. No abdominopelvic ascites. Musculoskeletal: No acute fracture. IMPRESSION: 1. No acute abnormality of the chest, abdomen, or pelvis. 2.  Aortic atherosclerosis.  (ICD10-I70.0) 3. Normal appendix. 4. No evidence for bowel or solid organ injury. Electronically Signed   By: Norva Pavlov M.D.   On: 07/17/2018 15:57   Ct T-spine No Charge  Result Date: 07/17/2018 CLINICAL DATA:  Fall 10 feet.  Trauma. EXAM:  CT THORACIC SPINE WITHOUT CONTRAST TECHNIQUE: Multidetector CT images of the thoracic were obtained using the standard protocol without intravenous contrast. COMPARISON:  None. FINDINGS: Alignment: Normal Vertebrae: Negative for fracture or mass Paraspinal and other soft tissues: Negative for paraspinous soft tissue swelling or fluid. Disc levels: Disc spaces maintained. Negative for disc protrusion or stenosis. IMPRESSION: Negative Electronically Signed   By: Marlan Palau M.D.   On: 07/17/2018 15:49    Procedures Procedures (including critical care time)  Medications Ordered in ED Medications  fentaNYL (SUBLIMAZE) injection 50 mcg (50 mcg Intravenous Given 07/17/18 1359)  fentaNYL (SUBLIMAZE) injection 50 mcg (50 mcg Intravenous Given 07/17/18 1446)  iopamidol (ISOVUE-300) 61 % injection 100 mL (100 mLs Intravenous Contrast Given 07/17/18 1520)  oxyCODONE (Oxy IR/ROXICODONE) immediate release tablet 10 mg (10 mg Oral Given 07/17/18 1629)     Initial Impression / Assessment and Plan / ED Course  I have reviewed the triage vital signs and the nursing notes.  Pertinent labs & imaging results that were available during my care of the patient were reviewed by me and considered in my medical decision making (see chart for details).  Clinical Course as of  Jul 18 950  Sat Jul 17, 2018  1604 CT imaging of his head C-spine chest and abdomen with T-spine recons do not show any obvious findings.  We will get the patient up in see if there is any other concerning findings.   [MB]    Clinical Course User Index [MB] Terrilee Files, MD      Final Clinical Impressions(s) / ED Diagnoses   Final diagnoses:  Traumatic injury of head, initial encounter  Neck sprain, initial encounter  Acute chest wall pain  Fall, initial encounter    ED Discharge Orders         Ordered    ibuprofen (ADVIL,MOTRIN) 800 MG tablet  Every 8 hours PRN     07/17/18 1824    oxyCODONE (ROXICODONE) 5 MG immediate release tablet  Every 4 hours PRN     07/17/18 1824           Terrilee Files, MD 07/18/18 [phone]

## 2018-07-17 NOTE — ED Notes (Signed)
Patient was able to ambulate in hallway without assistance with steady gait

## 2019-08-18 IMAGING — CT CT CHEST W/ CM
3 of 8 series · 15 of 36 positions shown, 17 images · IV contrast (iopamidol)
Comparison: None.

CLINICAL DATA: The patient fell 10 ft off roof onto debris, landing
on head and twisted neck. Wheezing 91% and placed on 4 lpm 96% .
[DATE] after med,pain in neck, sternum and upper back Cut on left hand

EXAM:
CT CHEST, ABDOMEN, AND PELVIS WITH CONTRAST
TECHNIQUE: Multidetector CT imaging of the chest, abdomen and pelvis was
performed following the standard protocol during bolus
administration of intravenous contrast.
CONTRAST:  100mL U131M8-NII IOPAMIDOL (U131M8-NII) INJECTION 61%

[Series 3: cap 5.0 i31f 2 · axial · 0.75mm/px · z∈[+562,+1012]mm · 6 of 128 slices shown, 8 images]
[im 19/128  mediastinal]
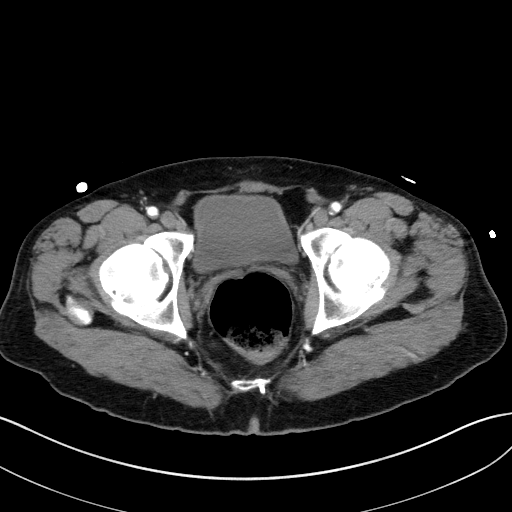
[im 19/128  lung]
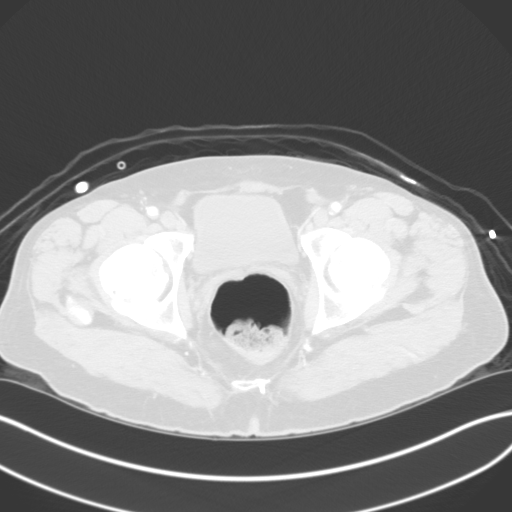
[im 37/128  lung]
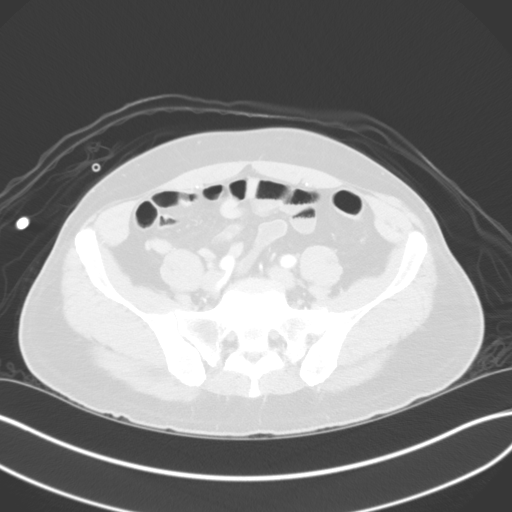
[im 55/128  lung]
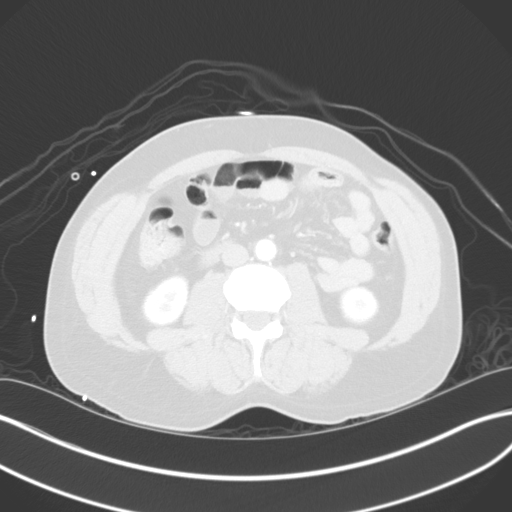
[im 73/128  lung]
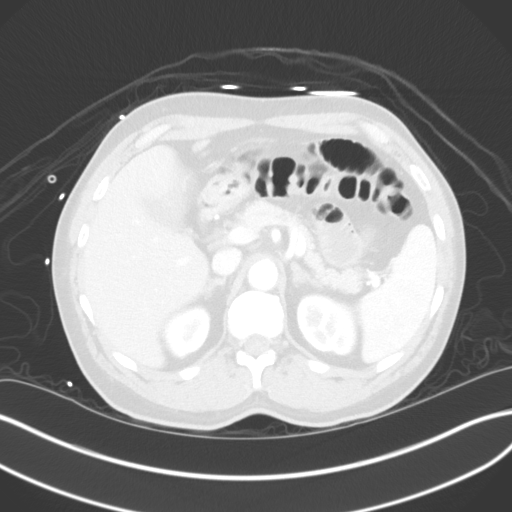
[im 91/128  mediastinal]
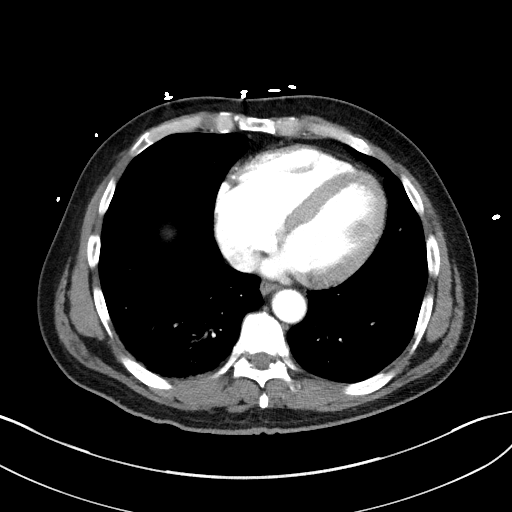
[im 91/128  lung]
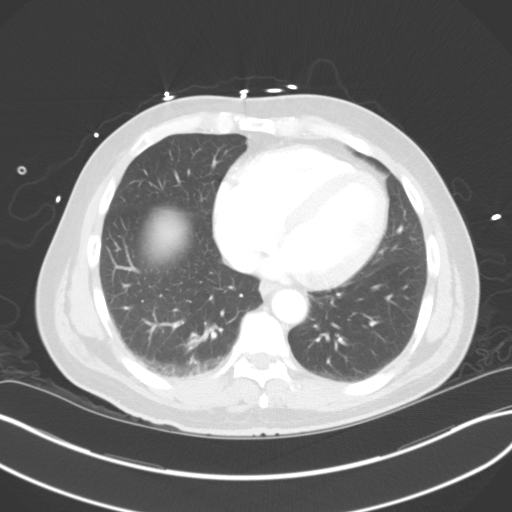
[im 109/128  lung]
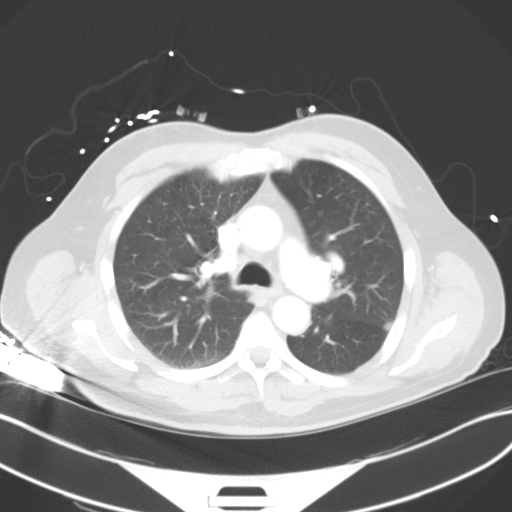

[Series 6: coronal · coronal · 0.75mm/px · 2 of 144 slices shown]
[im 48/144  lung]
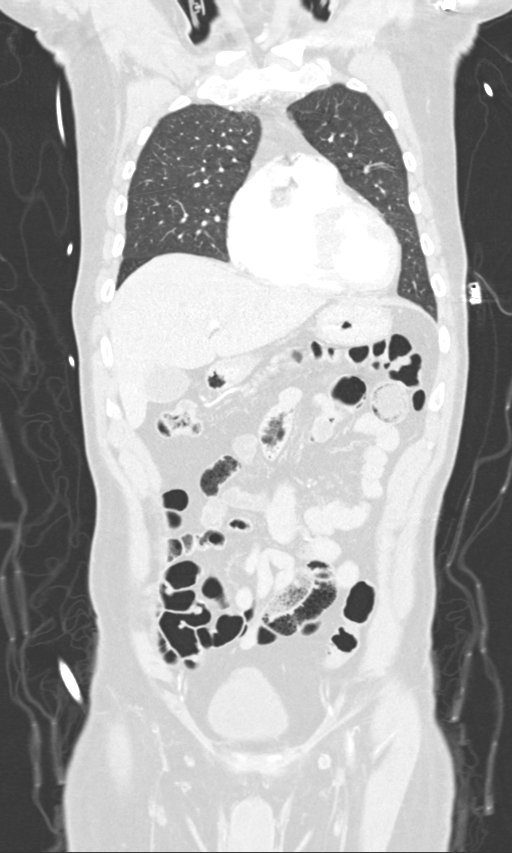
[im 96/144  lung]
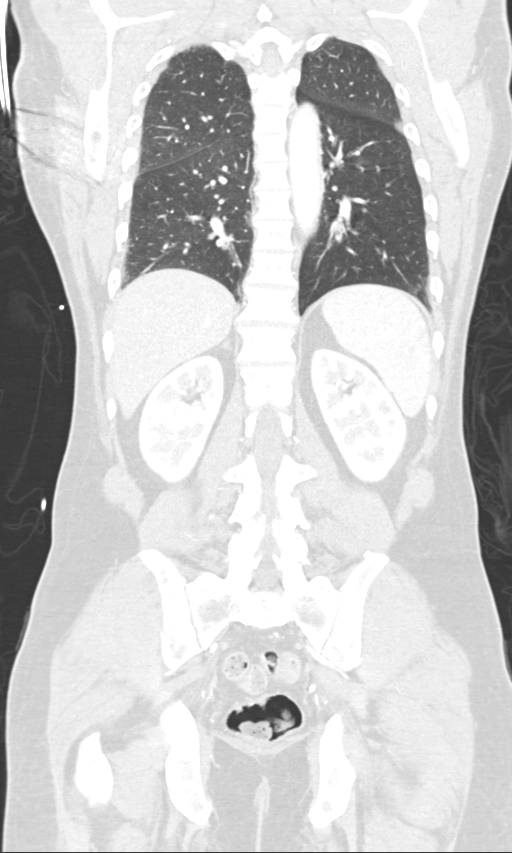

[Series 9: t-spine st · axial · 0.30mm/px · z∈[+841,+1039]mm · 7 of 150 slices shown]
[im 17/150  lung]
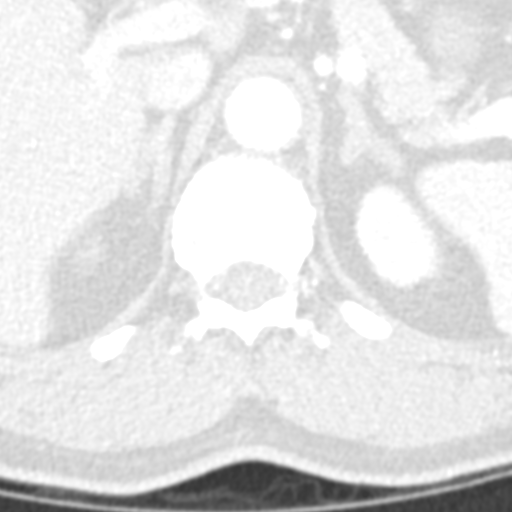
[im 34/150  lung]
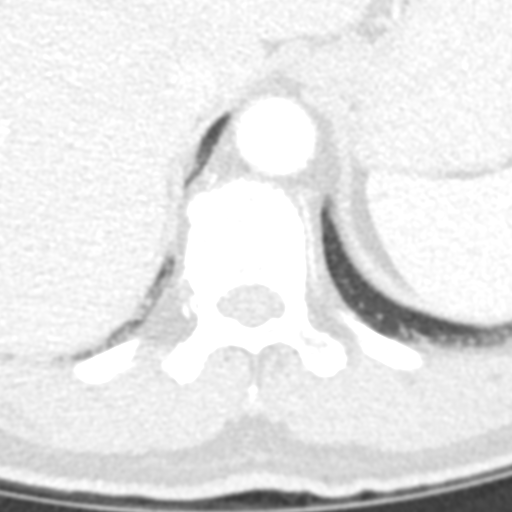
[im 50/150  lung]
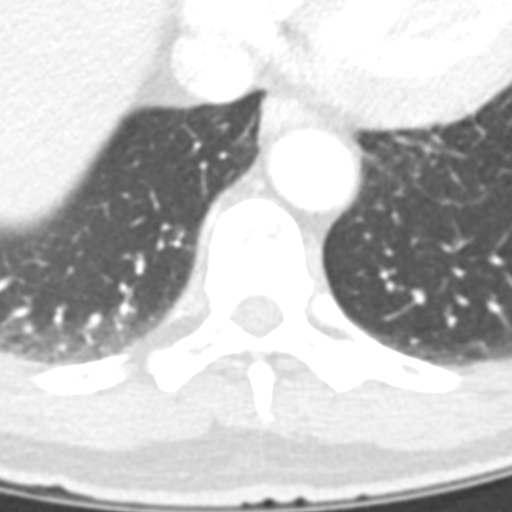
[im 67/150  lung]
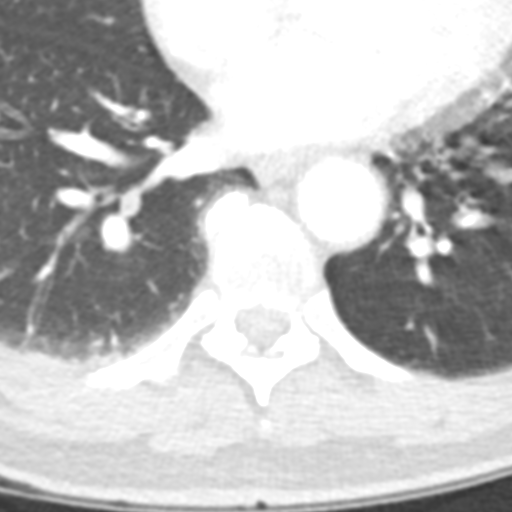
[im 83/150  lung]
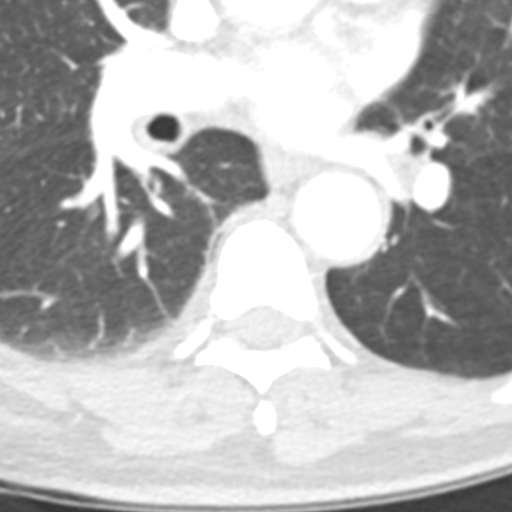
[im 100/150  lung]
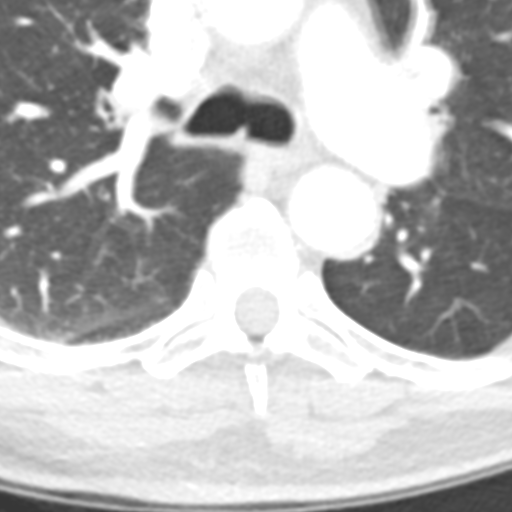
[im 116/150  lung]
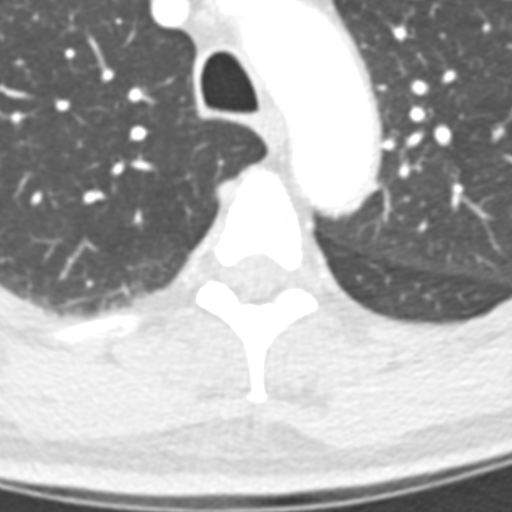

[15 of 36 positions shown; findings below may reference images not displayed]

FINDINGS: CT CHEST FINDINGS

Cardiovascular: Heart size is normal. No pericardial effusion. There
is minimal calcification of the thoracic aorta not associated with
aneurysm. The pulmonary arteries are normal appearance accounting
for contrast bolus timing.

Mediastinum/Nodes: The visualized portion of the thyroid gland has a
normal appearance. Esophagus is normal in appearance. No
mediastinal, hilar, or axillary adenopathy.

Lungs/Pleura: No pneumothorax or contusion. No pleural effusions or
pulmonary nodules.

Musculoskeletal: Minimal midthoracic spondylosis. No acute fracture.

CT ABDOMEN PELVIS FINDINGS

Hepatobiliary: No hepatic injury or perihepatic hematoma.
Gallbladder is unremarkable

Pancreas: Unremarkable. No pancreatic ductal dilatation or
surrounding inflammatory changes.

Spleen: No splenic injury or perisplenic hematoma.

Adrenals/Urinary Tract: A small lesion in the RIGHT kidney is too
small to characterize. An 8 millimeter lesion in the anterior LOWER
pole of the LEFT kidney is indeterminate by Hounsfield unit
measurements. There is no hydronephrosis. The ureters are
unremarkable. Urinary bladder and visualized urethra are
unremarkable.

Stomach/Bowel: Stomach and small bowel loops are normal in
appearance. The appendix is well seen and has a normal appearance.
Numerous colonic diverticula are present. No acute diverticulitis.

Vascular/Lymphatic: There is atherosclerotic calcification of the
abdominal aorta not associated with aneurysm. No retroperitoneal or
mesenteric adenopathy.

Reproductive: Prostatic calcifications.

Other: No abdominal wall hernia or abnormality. No abdominopelvic
ascites.

Musculoskeletal: No acute fracture.
IMPRESSION: 1. No acute abnormality of the chest, abdomen, or pelvis.
2.  Aortic atherosclerosis.  ([id number])
3. Normal appendix.
4. No evidence for bowel or solid organ injury.

## 2019-12-31 ENCOUNTER — Other Ambulatory Visit: Payer: Self-pay

## 2019-12-31 ENCOUNTER — Emergency Department (HOSPITAL_COMMUNITY)
Admission: EM | Admit: 2019-12-31 | Discharge: 2019-12-31 | Disposition: A | Discharge status: Home / Self Care | Payer: Self-pay | Attending: Emergency Medicine | Admitting: Emergency Medicine

## 2019-12-31 ENCOUNTER — Encounter (HOSPITAL_COMMUNITY): Payer: Self-pay | Admitting: Emergency Medicine

## 2019-12-31 ENCOUNTER — Emergency Department (HOSPITAL_COMMUNITY): Payer: Self-pay

## 2019-12-31 DIAGNOSIS — Y999 Unspecified external cause status: Secondary | ICD-10-CM | POA: Insufficient documentation

## 2019-12-31 DIAGNOSIS — F17210 Nicotine dependence, cigarettes, uncomplicated: Secondary | ICD-10-CM | POA: Insufficient documentation

## 2019-12-31 DIAGNOSIS — M546 Pain in thoracic spine: Secondary | ICD-10-CM | POA: Insufficient documentation

## 2019-12-31 DIAGNOSIS — W109XXA Fall (on) (from) unspecified stairs and steps, initial encounter: Secondary | ICD-10-CM | POA: Insufficient documentation

## 2019-12-31 DIAGNOSIS — Y9301 Activity, walking, marching and hiking: Secondary | ICD-10-CM | POA: Insufficient documentation

## 2019-12-31 DIAGNOSIS — I10 Essential (primary) hypertension: Secondary | ICD-10-CM | POA: Insufficient documentation

## 2019-12-31 DIAGNOSIS — S299XXA Unspecified injury of thorax, initial encounter: Secondary | ICD-10-CM | POA: Insufficient documentation

## 2019-12-31 DIAGNOSIS — Y929 Unspecified place or not applicable: Secondary | ICD-10-CM | POA: Insufficient documentation

## 2019-12-31 MED ORDER — IBUPROFEN 800 MG PO TABS: 800.0000 mg | ORAL_TABLET | Freq: Three times a day (TID) | ORAL | 0 refills | Status: AC | PRN

## 2019-12-31 MED ORDER — ALBUTEROL SULFATE HFA 108 (90 BASE) MCG/ACT IN AERS
2.0000 | INHALATION_SPRAY | Freq: Once | RESPIRATORY_TRACT | Status: AC
  Administered 2019-12-31: 15:00:00 2 via RESPIRATORY_TRACT
  Filled 2019-12-31: qty 6.7

## 2019-12-31 MED ORDER — IBUPROFEN 800 MG PO TABS
800.0000 mg | ORAL_TABLET | Freq: Once | ORAL | Status: AC
  Administered 2019-12-31: 15:00:00 800 mg via ORAL
  Filled 2019-12-31: qty 1

## 2019-12-31 NOTE — ED Notes (Signed)
Patient to xray.

## 2019-12-31 NOTE — Discharge Instructions (Signed)
You have been evaluated for your chest wall injury.  Fortunately your xray did not show any obvious broken ribs, collapse lung or pneumonia.  However, xray sometimes may missed small rib fracture.  Use incentive spirometry several times daily to help keeps lung well ventilated to prevent lung infection.  Take ibuprofen as needed for pain.  Use albuterol inhaler 2 puffs every 4 hours as needed for shortness of breath.  Return if you have any concerns.

## 2019-12-31 NOTE — ED Provider Notes (Signed)
[facility] Provider Note   CSN: [id number] Arrival date & time: 12/31/19  1409     History Chief Complaint  Patient presents with  . Fall    Marcques Wrightsman Otting is a 59 y.o. male.  The history is provided by the patient. No language interpreter was used.  Fall Pertinent negatives include no chest pain.     59 year old male presenting for evaluation of a prior fall.  Patient report 8 days ago he was walking down some steps, slipped, fell and struck his right upper back and chest against the steps.  He denies hitting his head or loss of consciousness.  He has to stay in that position for several minutes before he was able to get up.  He did endorse pain to the affected area pain is sharp, throbbing, worse with breathing and with movement.  Pain did subsequently improved however this morning as he got up he felt an acute onset of pain to the same location which has been persistent and intensify.  Pain is moderate in severity.  He has a chronic cough without any hemoptysis.  No fever or chills no abdominal pain no nausea or vomiting.  He takes Bayer aspirin at home as needed for pain.  Past Medical History:  Diagnosis Date  . Hypertension     There are no problems to display for this patient.   History reviewed. No pertinent surgical history.     Family History  Problem Relation Age of Onset  . Hypertension Mother   . Diabetes Mother   . Diabetes Other     Social History   Tobacco Use  . Smoking status: Current Some Day Smoker    Packs/day: 1.00    Years: 20.00    Pack years: 20.00    Types: Cigarettes  . Smokeless tobacco: Never Used  Substance Use Topics  . Alcohol use: No  . Drug use: No    Home Medications Prior to Admission medications   Medication Sig Start Date End Date Taking? Authorizing Provider  amoxicillin (AMOXIL) 500 MG capsule 2 po bid with food Patient not taking: Reported on 07/17/2018 06/24/11   Lily Kocher, PA-C    ciprofloxacin (CIPRO) 500 MG tablet One po bid Patient not taking: Reported on 07/17/2018 05/14/17   Milton Ferguson, MD  ibuprofen (ADVIL,MOTRIN) 800 MG tablet Take 1 tablet (800 mg total) by mouth every 8 (eight) hours as needed. 07/17/18   Hayden Rasmussen, MD  oxyCODONE (ROXICODONE) 5 MG immediate release tablet Take 1 tablet (5 mg total) by mouth every 4 (four) hours as needed for severe pain. 07/17/18   Hayden Rasmussen, MD  traMADol (ULTRAM) 50 MG tablet Take 1 tablet (50 mg total) by mouth every 6 (six) hours as needed. Patient not taking: Reported on 07/17/2018 05/14/17   Milton Ferguson, MD    Allergies    Tylenol [acetaminophen]  Review of Systems   Review of Systems  Constitutional: Negative for fever.  Cardiovascular: Negative for chest pain.  Musculoskeletal: Positive for back pain.  Skin: Negative for wound.  Neurological: Negative for numbness.    Physical Exam Updated Vital Signs BP (!) 153/81 (BP Location: Right Arm)   Pulse 65   Temp 98.6 F (37 C) (Oral)   Resp 16   Ht 5\' 5"  (1.651 m)   Wt 72.6 kg   SpO2 95%   BMI 26.63 kg/m   Physical Exam Vitals and nursing note reviewed.  Constitutional:  General: He is not in acute distress.    Appearance: He is well-developed.  HENT:     Head: Atraumatic.  Eyes:     Conjunctiva/sclera: Conjunctivae normal.  Cardiovascular:     Rate and Rhythm: Normal rate and regular rhythm.     Pulses: Normal pulses.     Heart sounds: Normal heart sounds.  Pulmonary:     Breath sounds: Rhonchi and rales (Faint crackles heard at lung bases.) present.  Chest:     Chest wall: Tenderness (Tenderness to right posterior back, and lateral chest wall on palpation without any crepitus or emphysema.  No bruising noted.) present.  Abdominal:     Palpations: Abdomen is soft.     Tenderness: There is no abdominal tenderness.  Musculoskeletal:     Cervical back: Neck supple.  Skin:    Findings: No rash.  Neurological:     Mental  Status: He is alert.     ED Results / Procedures / Treatments   Labs (all labs ordered are listed, but only abnormal results are displayed) Labs Reviewed - No data to display  EKG None  Radiology DG Ribs Unilateral W/Chest Right  Result Date: 12/31/2019 CLINICAL DATA:  Pt states he fell onto his right side on Friday and now is c/o right sided chest and flank pain. EXAM: RIGHT RIBS AND CHEST - 3+ VIEW COMPARISON:  CT chest 07/17/2018 FINDINGS: No displaced fracture or other bone lesions are seen involving the ribs. There is no evidence of pneumothorax or pleural effusion. Both lungs are clear. A tiny nodular opacity projecting over the right lower lung likely represents the nipple shadow. Heart size and mediastinal contours are within normal limits. IMPRESSION: No displaced fracture identified involving the right-sided ribs. No pneumothorax. Electronically Signed   By: Emmaline Kluver M.D.   On: 12/31/2019 14:50    Procedures Procedures (including critical care time)  Medications Ordered in ED Medications  ibuprofen (ADVIL) tablet 800 mg (800 mg Oral Given 12/31/19 1448)    ED Course  I have reviewed the triage vital signs and the nursing notes.  Pertinent labs & imaging results that were available during my care of the patient were reviewed by me and considered in my medical decision making (see chart for details).    MDM Rules/Calculators/A&P                      BP (!) 153/81 (BP Location: Right Arm)   Pulse 65   Temp 98.6 F (37 C) (Oral)   Resp 16   Ht 5\' 5"  (1.651 m)   Wt 72.6 kg   SpO2 95%   BMI 26.63 kg/m   Final Clinical Impression(s) / ED Diagnoses Final diagnoses:  Chest wall injury, initial encounter    Rx / DC Orders ED Discharge Orders    None     2:35 PM Patient here with chest and right-sided back pain from a previous fall, pain worsened today.  He is a smoker, on exam, he does have diffuse crackles heard on both lung field as well as exquisite  tenderness to palpation of his right lateral chest wall without crepitus or emphysema.  Will obtain ribs x-ray for evaluation.  Ibuprofen given for pain.  He is afebrile, no hypoxia.  3:04 PM Xray of R ribs and chest without evidence of displaced fracture or pneumothorax.  Chest is cleared.  Pt made aware sometimes xray may miss nondisplaced ribs fx.  Given his complaint, will give  incentive spirometry, ibuprofen, and albuterol as treatment for suspected rib fx.  Low suspicion of abdominal pathology.  Return precaution discussed.  Doubt covid-19 infection as pt denies fever, viral sxs or worsening cough.    Fayrene Helper, PA-C 12/31/19 1513    Pricilla Loveless, MD 01/01/20 [phone]

## 2019-12-31 NOTE — ED Triage Notes (Signed)
Patient fell last Friday and complains of right side rib and back pain. Patient fell last Friday.

## 2020-11-13 ENCOUNTER — Emergency Department (HOSPITAL_COMMUNITY)
Admission: EM | Admit: 2020-11-13 | Discharge: 2020-11-13 | Discharge status: Against Medical Advice | Payer: HRSA Program | Attending: Emergency Medicine | Admitting: Emergency Medicine

## 2020-11-13 ENCOUNTER — Encounter (HOSPITAL_COMMUNITY): Payer: Self-pay | Admitting: Emergency Medicine

## 2020-11-13 ENCOUNTER — Other Ambulatory Visit: Payer: Self-pay

## 2020-11-13 DIAGNOSIS — F17210 Nicotine dependence, cigarettes, uncomplicated: Secondary | ICD-10-CM | POA: Diagnosis not present

## 2020-11-13 DIAGNOSIS — R509 Fever, unspecified: Secondary | ICD-10-CM | POA: Diagnosis present

## 2020-11-13 DIAGNOSIS — J449 Chronic obstructive pulmonary disease, unspecified: Secondary | ICD-10-CM | POA: Insufficient documentation

## 2020-11-13 DIAGNOSIS — U071 COVID-19: Secondary | ICD-10-CM

## 2020-11-13 DIAGNOSIS — I10 Essential (primary) hypertension: Secondary | ICD-10-CM | POA: Diagnosis not present

## 2020-11-13 HISTORY — DX: Chronic obstructive pulmonary disease, unspecified: J44.9

## 2020-11-13 LAB — I-STAT CHEM 8, ED
BUN: 10 mg/dL (ref 6–20)
Calcium, Ion: 1.11 mmol/L — ABNORMAL LOW (ref 1.15–1.40)
Chloride: 102 mmol/L (ref 98–111)
Creatinine, Ser: 0.9 mg/dL (ref 0.61–1.24)
Glucose, Bld: 83 mg/dL (ref 70–99)
HCT: 43 % (ref 39.0–52.0)
Hemoglobin: 14.6 g/dL (ref 13.0–17.0)
Potassium: 3.3 mmol/L — ABNORMAL LOW (ref 3.5–5.1)
Sodium: 138 mmol/L (ref 135–145)
TCO2: 21 mmol/L — ABNORMAL LOW (ref 22–32)

## 2020-11-13 LAB — RESP PANEL BY RT-PCR (FLU A&B, COVID) ARPGX2
Influenza A by PCR: NEGATIVE
Influenza B by PCR: NEGATIVE
SARS Coronavirus 2 by RT PCR: POSITIVE — AB

## 2020-11-13 MED ORDER — ALBUTEROL SULFATE HFA 108 (90 BASE) MCG/ACT IN AERS
4.0000 | INHALATION_SPRAY | Freq: Once | RESPIRATORY_TRACT | Status: AC
  Administered 2020-11-13: 4 via RESPIRATORY_TRACT
  Filled 2020-11-13: qty 6.7

## 2020-11-13 MED ORDER — IBUPROFEN 800 MG PO TABS
800.0000 mg | ORAL_TABLET | Freq: Once | ORAL | Status: AC
  Administered 2020-11-13: 800 mg via ORAL
  Filled 2020-11-13: qty 1

## 2020-11-13 MED ORDER — POTASSIUM CHLORIDE CRYS ER 20 MEQ PO TBCR: 40.0000 meq | EXTENDED_RELEASE_TABLET | Freq: Once | ORAL | Status: DC

## 2020-11-13 NOTE — ED Triage Notes (Signed)
Pt was exposed to covid and c/o a fever, sore throat, HA, and loss of taste.

## 2020-11-13 NOTE — ED Provider Notes (Signed)
[facility] EMERGENCY DEPARTMENT Provider Note   CSN: [id number] Arrival date & time: 11/13/20  1502     History Chief Complaint  Patient presents with  . Fever    Nathan Rhodes is a 59 y.o. male.  HPI      Nathan Rhodes is a 59 y.o. male with hx of COPD and HTN, who presents to the Emergency Department complaining of generalized body aches, fever, sore throat and increasing fatigue.  Symptoms began yesterday.  States that several family members are positive for covid.  Tmax of 102 today.  He has not taken any medication for symptom relief.  His symptoms are also associated with shortness of breath and occasional cough which he states is typical for him and he denies increased work of breathing.  Supposed to use a rescue inhaler, but states he has ran out and does not currently have a PCP.  He denies chest or abdominal pain, diarrhea or vomiting.  He has not been vaccinated against covid 19.  He continues to smoke.     Past Medical History:  Diagnosis Date  . COPD (chronic obstructive pulmonary disease) (HCC)   . Hypertension     There are no problems to display for this patient.   History reviewed. No pertinent surgical history.     Family History  Problem Relation Age of Onset  . Hypertension Mother   . Diabetes Mother   . Diabetes Other     Social History   Tobacco Use  . Smoking status: Current Some Day Smoker    Packs/day: 1.00    Years: 20.00    Pack years: 20.00    Types: Cigarettes  . Smokeless tobacco: Never Used  Vaping Use  . Vaping Use: Never used  Substance Use Topics  . Alcohol use: No  . Drug use: No    Home Medications Prior to Admission medications   Medication Sig Start Date End Date Taking? Authorizing Provider  ibuprofen (ADVIL) 800 MG tablet Take 1 tablet (800 mg total) by mouth every 8 (eight) hours as needed for moderate pain. Patient not taking: No sig reported 12/31/19   Fayrene Helper, PA-C  oxyCODONE (ROXICODONE) 5 MG  immediate release tablet Take 1 tablet (5 mg total) by mouth every 4 (four) hours as needed for severe pain. Patient not taking: No sig reported 07/17/18   Terrilee Files, MD    Allergies    Tylenol [acetaminophen]  Review of Systems   Review of Systems  Constitutional: Positive for chills, fatigue and fever. Negative for appetite change.  HENT: Positive for congestion and sore throat. Negative for trouble swallowing.   Respiratory: Positive for cough, shortness of breath and wheezing. Negative for chest tightness.   Cardiovascular: Negative for chest pain.  Gastrointestinal: Negative for abdominal pain, diarrhea, nausea and vomiting.  Genitourinary: Negative for difficulty urinating, dysuria and flank pain.  Musculoskeletal: Positive for myalgias. Negative for arthralgias.  Skin: Negative for rash.  Neurological: Negative for dizziness, speech difficulty, weakness and numbness.  Hematological: Negative for adenopathy.  Psychiatric/Behavioral: Negative for confusion.    Physical Exam Updated Vital Signs BP (!) 175/98 (BP Location: Right Arm)   Pulse 85   Temp (!) 102.3 F (39.1 C) (Oral)   Resp (!) 22   Ht 5\' 6"  (1.676 m)   Wt 72.6 kg   SpO2 95%   BMI 25.82 kg/m   Physical Exam Vitals and nursing note reviewed.  Constitutional:      General: He  is not in acute distress.    Appearance: Normal appearance. He is not toxic-appearing.  Cardiovascular:     Rate and Rhythm: Normal rate and regular rhythm.     Pulses: Normal pulses.  Pulmonary:     Effort: Pulmonary effort is normal. No respiratory distress.     Breath sounds: Wheezing present.     Comments: Scattered expiratory wheezes bilaterally.  No increased work of breathing.   Abdominal:     General: There is no distension.     Palpations: Abdomen is soft.     Tenderness: There is no abdominal tenderness.  Musculoskeletal:        General: Normal range of motion.     Cervical back: Normal range of motion.      Right lower leg: No edema.     Left lower leg: No edema.  Skin:    General: Skin is warm.     Capillary Refill: Capillary refill takes less than 2 seconds.     Findings: No rash.  Neurological:     General: No focal deficit present.     Mental Status: He is alert.     Sensory: No sensory deficit.     Motor: No weakness.     ED Results / Procedures / Treatments   Labs (all labs ordered are listed, but only abnormal results are displayed) Labs Reviewed  RESP PANEL BY RT-PCR (FLU A&B, COVID) ARPGX2 - Abnormal; Notable for the following components:      Result Value   SARS Coronavirus 2 by RT PCR POSITIVE (*)    All other components within normal limits  I-STAT CHEM 8, ED - Abnormal; Notable for the following components:   Potassium 3.3 (*)    Calcium, Ion 1.11 (*)    TCO2 21 (*)    All other components within normal limits    EKG None  Radiology No results found.  Procedures Procedures (including critical care time)  Medications Ordered in ED Medications  albuterol (VENTOLIN HFA) 108 (90 Base) MCG/ACT inhaler 4 puff (has no administration in time range)  ibuprofen (ADVIL) tablet 800 mg (800 mg Oral Given 11/13/20 1536)    ED Course  I have reviewed the triage vital signs and the nursing notes.  Pertinent labs & imaging results that were available during my care of the patient were reviewed by me and considered in my medical decision making (see chart for details).    MDM Rules/Calculators/A&P                          Pt here with sx's highly suggestive of covid.  Admits to recent exposure.  Reports cough and shortness of breath that are chronic and associated with his COPD.  Ran out of his inhaler.    Covid test +, no hypoxia.  Febrile here, but has not taken any antipyretics.  Endorses allergy to tylenol.  No acute respiratory distress here.  I feel that he is appropriate for d/c home.  Will check creatinine as no recent labs before giving NSAID.  He meets  criteria for [facility] infusion, will try to arrange this as out patient.    Contacted [facility] clinic, they will reach out to the patient w/in 48 hours.    1920  When I went in to notify pt, he had left the dept.  He was informed of his covid results and need to home isolate.   Final Clinical Impression(s) / ED Diagnoses Final  diagnoses:  COVID-19 virus infection    Rx / DC Orders ED Discharge Orders    None       Pauline Aus, PA-C 11/13/20 1925    Terrilee Files, MD 11/14/20 1250

## 2020-11-13 NOTE — ED Notes (Signed)
Date and time results received: 11/13/20 1645   Test: COVID Critical Value: Postive  Name of Provider Notified: Charm Barges  Orders Received? Or Actions Taken?:

## 2021-07-02 ENCOUNTER — Ambulatory Visit
Admission: RE | Admit: 2021-07-02 | Discharge: 2021-07-02 | Discharge status: Absent without leave | Payer: Medicaid Other | Source: Ambulatory Visit

## 2021-07-02 ENCOUNTER — Other Ambulatory Visit: Payer: Self-pay

## 2021-11-26 ENCOUNTER — Emergency Department (HOSPITAL_COMMUNITY)
Admission: EM | Admit: 2021-11-26 | Discharge: 2021-11-26 | Disposition: A | Discharge status: Home / Self Care | Payer: Medicaid Other | Source: Home / Self Care

## 2022-02-16 ENCOUNTER — Emergency Department (HOSPITAL_COMMUNITY): Payer: 59

## 2022-02-16 ENCOUNTER — Other Ambulatory Visit: Payer: Self-pay

## 2022-02-16 ENCOUNTER — Encounter (HOSPITAL_COMMUNITY): Payer: Self-pay | Admitting: Emergency Medicine

## 2022-02-16 ENCOUNTER — Emergency Department (HOSPITAL_COMMUNITY)
Admission: EM | Admit: 2022-02-16 | Discharge: 2022-02-16 | Disposition: A | Discharge status: Home / Self Care | Payer: 59 | Attending: Emergency Medicine | Admitting: Emergency Medicine

## 2022-02-16 DIAGNOSIS — R739 Hyperglycemia, unspecified: Secondary | ICD-10-CM | POA: Insufficient documentation

## 2022-02-16 DIAGNOSIS — I10 Essential (primary) hypertension: Secondary | ICD-10-CM | POA: Insufficient documentation

## 2022-02-16 DIAGNOSIS — J441 Chronic obstructive pulmonary disease with (acute) exacerbation: Secondary | ICD-10-CM | POA: Diagnosis not present

## 2022-02-16 DIAGNOSIS — Z87891 Personal history of nicotine dependence: Secondary | ICD-10-CM | POA: Insufficient documentation

## 2022-02-16 DIAGNOSIS — R0602 Shortness of breath: Secondary | ICD-10-CM | POA: Diagnosis present

## 2022-02-16 DIAGNOSIS — E876 Hypokalemia: Secondary | ICD-10-CM | POA: Diagnosis not present

## 2022-02-16 LAB — CBC WITH DIFFERENTIAL/PLATELET
Abs Immature Granulocytes: 0.03 10*3/uL (ref 0.00–0.07)
Basophils Absolute: 0.1 10*3/uL (ref 0.0–0.1)
Basophils Relative: 1 %
Eosinophils Absolute: 0.3 10*3/uL (ref 0.0–0.5)
Eosinophils Relative: 3 %
HCT: 47.5 % (ref 39.0–52.0)
Hemoglobin: 15.7 g/dL (ref 13.0–17.0)
Immature Granulocytes: 0 %
Lymphocytes Relative: 27 %
Lymphs Abs: 2.1 10*3/uL (ref 0.7–4.0)
MCH: 30.2 pg (ref 26.0–34.0)
MCHC: 33.1 g/dL (ref 30.0–36.0)
MCV: 91.3 fL (ref 80.0–100.0)
Monocytes Absolute: 0.7 10*3/uL (ref 0.1–1.0)
Monocytes Relative: 8 %
Neutro Abs: 4.8 10*3/uL (ref 1.7–7.7)
Neutrophils Relative %: 61 %
Platelets: 270 10*3/uL (ref 150–400)
RBC: 5.2 MIL/uL (ref 4.22–5.81)
RDW: 12 % (ref 11.5–15.5)
WBC: 7.9 10*3/uL (ref 4.0–10.5)
nRBC: 0 % (ref 0.0–0.2)

## 2022-02-16 LAB — BASIC METABOLIC PANEL
Anion gap: 9 (ref 5–15)
BUN: 13 mg/dL (ref 6–20)
CO2: 23 mmol/L (ref 22–32)
Calcium: 8.7 mg/dL — ABNORMAL LOW (ref 8.9–10.3)
Chloride: 106 mmol/L (ref 98–111)
Creatinine, Ser: 0.89 mg/dL (ref 0.61–1.24)
GFR, Estimated: >60 mL/min (ref >60)
Glucose, Bld: 158 mg/dL — ABNORMAL HIGH (ref 70–99)
Potassium: 3.2 mmol/L — ABNORMAL LOW (ref 3.5–5.1)
Sodium: 138 mmol/L (ref 135–145)

## 2022-02-16 LAB — BRAIN NATRIURETIC PEPTIDE: B Natriuretic Peptide: 71 pg/mL (ref 0.0–100.0)

## 2022-02-16 MED ORDER — ACETAMINOPHEN 500 MG PO TABS
1000.0000 mg | ORAL_TABLET | Freq: Once | ORAL | Status: DC
  Filled 2022-02-16: qty 2

## 2022-02-16 MED ORDER — ALBUTEROL SULFATE HFA 108 (90 BASE) MCG/ACT IN AERS
2.0000 | INHALATION_SPRAY | Freq: Once | RESPIRATORY_TRACT | Status: AC
  Administered 2022-02-16: 2 via RESPIRATORY_TRACT
  Filled 2022-02-16: qty 6.7

## 2022-02-16 MED ORDER — METHYLPREDNISOLONE SODIUM SUCC 125 MG IJ SOLR
125.0000 mg | Freq: Once | INTRAMUSCULAR | Status: AC
  Administered 2022-02-16: 125 mg via INTRAVENOUS
  Filled 2022-02-16: qty 2

## 2022-02-16 MED ORDER — IPRATROPIUM-ALBUTEROL 0.5-2.5 (3) MG/3ML IN SOLN
3.0000 mL | Freq: Once | RESPIRATORY_TRACT | Status: AC
  Administered 2022-02-16: 3 mL via RESPIRATORY_TRACT
  Filled 2022-02-16: qty 3

## 2022-02-16 NOTE — ED Provider Notes (Signed)
?[facility] EMERGENCY DEPARTMENT ?Provider Note ? ? ?CSN: [id number] ?Arrival date & time: 02/16/22  1258 ? ?  ? ?History ? ?Chief Complaint  ?Patient presents with  ? Shortness of Breath  ? ? ?Nathan Rhodes is a 61 y.o. male. ? ? ?Shortness of Breath ? ?Patient with medical history notable for COPD, hypertension, former tobacco use presents today due to shortness of breath x2 days.  He does not have any inhalers at home, has not been able to try anything to alleviate his symptoms.  Ambulating makes the pain worse.  Denies any chest pain, lower extremity swelling, productive cough or fevers. ? ?Patient unsure what medicine he supposed to take for blood pressure.  Has not seen a pulmonologist, no primary care follow-up.  States "no one takes my insurance". ? ?Home Medications ?Prior to Admission medications   ?Medication Sig Start Date End Date Taking? Authorizing Provider  ?ibuprofen (ADVIL) 800 MG tablet Take 1 tablet (800 mg total) by mouth every 8 (eight) hours as needed for moderate pain. ?Patient not taking: No sig reported 12/31/19   Fayrene Helper, PA-C  ?oxyCODONE (ROXICODONE) 5 MG immediate release tablet Take 1 tablet (5 mg total) by mouth every 4 (four) hours as needed for severe pain. ?Patient not taking: No sig reported 07/17/18   Terrilee Files, MD  ?   ? ?Allergies    ?Tylenol [acetaminophen]   ? ?Review of Systems   ?Review of Systems  ?Respiratory:  Positive for shortness of breath.   ? ?Physical Exam ?Updated Vital Signs ?BP (!) 176/92   Pulse 63   Temp 98.3 ?F (36.8 ?C) (Oral)   Resp 19   Ht 5\' 6"  (1.676 m)   Wt 72.6 kg   SpO2 96%   BMI 25.82 kg/m?  ?Physical Exam ?Vitals and nursing note reviewed. Exam conducted with a chaperone present.  ?Constitutional:   ?   Appearance: Normal appearance.  ?   Comments: Disheveled, chronically ill-appearing but not in acute distress  ?HENT:  ?   Head: Normocephalic and atraumatic.  ?Eyes:  ?   General: No scleral icterus.    ?   Right eye: No discharge.      ?   Left eye: No discharge.  ?   Extraocular Movements: Extraocular movements intact.  ?   Pupils: Pupils are equal, round, and reactive to light.  ?Cardiovascular:  ?   Rate and Rhythm: Normal rate and regular rhythm.  ?   Pulses: Normal pulses.  ?   Heart sounds: Normal heart sounds. No murmur heard. ?  No friction rub. No gallop.  ?Pulmonary:  ?   Effort: Pulmonary effort is normal. Tachypnea present. No respiratory distress.  ?   Breath sounds: Decreased breath sounds and wheezing present.  ?Abdominal:  ?   General: Abdomen is flat. Bowel sounds are normal. There is no distension.  ?   Palpations: Abdomen is soft.  ?   Tenderness: There is no abdominal tenderness.  ?Musculoskeletal:  ?   Right lower leg: No edema.  ?   Left lower leg: No edema.  ?Skin: ?   General: Skin is warm and dry.  ?   Coloration: Skin is not jaundiced.  ?Neurological:  ?   Mental Status: He is alert. Mental status is at baseline.  ?   Coordination: Coordination normal.  ? ? ?ED Results / Procedures / Treatments   ?Labs ?(all labs ordered are listed, but only abnormal results are displayed) ?Labs Reviewed  ?  BASIC METABOLIC PANEL - Abnormal; Notable for the following components:  ?    Result Value  ? Potassium 3.2 (*)   ? Glucose, Bld 158 (*)   ? Calcium 8.7 (*)   ? All other components within normal limits  ?CBC WITH DIFFERENTIAL/PLATELET  ?BRAIN NATRIURETIC PEPTIDE  ? ? ?EKG ?None ? ?Radiology ?DG Chest 2 View ? ?Result Date: 02/16/2022 ?CLINICAL DATA:  Cough, shortness of breath EXAM: CHEST - 2 VIEW COMPARISON:  12/31/2019 FINDINGS: The heart size and mediastinal contours are within normal limits. Both lungs are clear. The visualized skeletal structures are unremarkable. IMPRESSION: No active cardiopulmonary disease. Electronically Signed   By: Duanne GuessNicholas  Plundo D.O.   On: 02/16/2022 13:31   ? ?Procedures ?Procedures  ? ? ?Medications Ordered in ED ?Medications  ?acetaminophen (TYLENOL) tablet 1,000 mg (has no administration in time  range)  ?ipratropium-albuterol (DUONEB) 0.5-2.5 (3) MG/3ML nebulizer solution 3 mL (3 mLs Nebulization Given 02/16/22 1414)  ?methylPREDNISolone sodium succinate (SOLU-MEDROL) 125 mg/2 mL injection 125 mg (125 mg Intravenous Given 02/16/22 1351)  ?albuterol (VENTOLIN HFA) 108 (90 Base) MCG/ACT inhaler 2 puff (2 puffs Inhalation Given 02/16/22 1414)  ? ? ?ED Course/ Medical Decision Making/ A&P ?  ?                        ?Medical Decision Making ?Amount and/or Complexity of Data Reviewed ?Labs: ordered. ?Radiology: ordered. ? ?Risk ?Prescription drug management. ? ? ?This patient presents to the ED for concern of shortness of breath, this involves an extensive number of treatment options, and is a complaint that carries with it a high risk of complications and morbidity.  The differential diagnosis includes but not limited to: COPD exacerbation, CHF, pneumonia, pneumothorax ? ?Patient?s presentation is complicated by their history of COPD and hypertension.  Additionally patient is not currently taking medicine in the outpatient setting, does not have primary care follow-up. ? ? ?Additional history obtained:  ? ?Reviewed external records.  Patient does not have a primary care, typically a presents to the ED with symptoms.  Not currently on any hypertensive medicine. ? ?  ?Lab Tests: ? ?I ordered, viewed, and personally interpreted labs.  The pertinent results include: No leukocytosis or anemia.  BMP stable.  Slight hyperglycemia at 156 but no evidence of an new onset of diabetes.  Mild hypokalemia at 3.2. ? ?  ?Imaging Studies ordered: ? ?I directly visualized the chest x-ray, which showed no acute process ? ?I agree with the radiologist interpretation ?  ? ?ECG/Cardiac monitoring:  ? ?Per my interpretation, EKG shows LVH. ? ?The patient was maintained on a cardiac monitor.  Visualized monitor strip which showed NSR HR 63 per my interpretation.  ? ? ?Medicines ordered and prescription drug management: ? ?I ordered  medication including: Solu-Medrol, albuterol, DuoNeb and Tylenol ? ?I have reviewed the patients home medicines and have made adjustments as needed ? ? ?Test Considered: ? ?Considered PE but patient is not having any chest pain.  Additionally the setting of COPD without medical compliance this seems more likely especially given he is not hypoxic or tachycardic.  Considered ACS but he is not having any chest pain and no new findings on EKG making ACS less likely. ? ?  ?Consultations Obtained: ? ?I requested consultation with the social work.  Discussed lab and imaging findings as well as pertinent plan - they recommend: [facility]. ? ? ?Reevaluation: ? ?  After the interventions noted above, I reevaluated the patient and found patient is improved, wheezing improved on exam. ? ? ?Problems addressed / ED Course: ?61 year old with history of COPD presents today with shortness of breath.  Presentation most consistent with COPD exacerbation, improved with DuoNeb and Solu-Medrol.  Patient's lung sounds improved, symptoms resolved.  Patient given given albuterol inhaler given he has been without medicine.  Spoke with social work in order to arrange patient appointment outpatient to hopefully set up some type of consistent care.  Patient not currently on any antihypertensive medicine.  He is not complaining of any chest pain or headaches, no signs of renal impairment in his work-up.  Although he is still hypertensive doubt hypertensive emergency or urgency.  Consider starting patient on antihypertensive medicine but I do feel that this is best managed in the out patient setting by primary. ?  ?Social Determinants of Health: ?No consistent primary follow-up in the outpatient ?  ?Disposition: ? ? ?After consideration of the diagnostic results and the patients response to treatment, I feel that the patent would benefit from outpatient follow-up with her primary  care doctor.. ? ?  ? ? ? ? ? ? ?Final Clinical Impression(s) / ED Diagnoses ?Final diagnoses:  ?COPD exacerbation (HCC)  ? ? ?Rx / DC Orders ?ED Discharge Orders   ? ? None  ? ?  ? ? ?  ?Theron Arista, PA-C ?02/16/22 155
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
## 2022-02-16 NOTE — ED Triage Notes (Signed)
Pt reports SOB x 2 days, reports hx of COPD and HTN ?

## 2022-02-16 NOTE — Discharge Instructions (Signed)
Use the inhaler as given for when you are short of breath.  Please follow-up with [facility] to establish care with a primary doctor.  Return to the ED if things change or worsen. ?

## 2022-02-16 NOTE — Care Management (Signed)
PCP placed in patient instructions ? ?

## 2022-09-09 ENCOUNTER — Emergency Department (HOSPITAL_COMMUNITY): Payer: Commercial Managed Care - HMO

## 2022-09-09 ENCOUNTER — Encounter (HOSPITAL_COMMUNITY): Payer: Self-pay | Admitting: Emergency Medicine

## 2022-09-09 ENCOUNTER — Emergency Department (HOSPITAL_COMMUNITY)
Admission: EM | Admit: 2022-09-09 | Discharge: 2022-09-09 | Disposition: A | Discharge status: Home / Self Care | Payer: Commercial Managed Care - HMO | Attending: Emergency Medicine | Admitting: Emergency Medicine

## 2022-09-09 ENCOUNTER — Other Ambulatory Visit: Payer: Self-pay

## 2022-09-09 DIAGNOSIS — J449 Chronic obstructive pulmonary disease, unspecified: Secondary | ICD-10-CM | POA: Insufficient documentation

## 2022-09-09 DIAGNOSIS — N451 Epididymitis: Secondary | ICD-10-CM | POA: Diagnosis not present

## 2022-09-09 DIAGNOSIS — Z7951 Long term (current) use of inhaled steroids: Secondary | ICD-10-CM | POA: Diagnosis not present

## 2022-09-09 DIAGNOSIS — R0602 Shortness of breath: Secondary | ICD-10-CM | POA: Insufficient documentation

## 2022-09-09 DIAGNOSIS — N50812 Left testicular pain: Secondary | ICD-10-CM | POA: Diagnosis present

## 2022-09-09 LAB — URINALYSIS, ROUTINE W REFLEX MICROSCOPIC
Bilirubin Urine: NEGATIVE
Glucose, UA: 50 mg/dL — AB
Hgb urine dipstick: NEGATIVE
Ketones, ur: NEGATIVE mg/dL
Leukocytes,Ua: NEGATIVE
Nitrite: NEGATIVE
Protein, ur: 30 mg/dL — AB
Specific Gravity, Urine: 1.021 (ref 1.005–1.030)
pH: 9 — ABNORMAL HIGH (ref 5.0–8.0)

## 2022-09-09 LAB — CBC WITH DIFFERENTIAL/PLATELET
Abs Immature Granulocytes: 0.04 10*3/uL (ref 0.00–0.07)
Basophils Absolute: 0.1 10*3/uL (ref 0.0–0.1)
Basophils Relative: 1 %
Eosinophils Absolute: 0.2 10*3/uL (ref 0.0–0.5)
Eosinophils Relative: 2 %
HCT: 42.2 % (ref 39.0–52.0)
Hemoglobin: 14.5 g/dL (ref 13.0–17.0)
Immature Granulocytes: 0 %
Lymphocytes Relative: 20 %
Lymphs Abs: 2 10*3/uL (ref 0.7–4.0)
MCH: 31.3 pg (ref 26.0–34.0)
MCHC: 34.4 g/dL (ref 30.0–36.0)
MCV: 90.9 fL (ref 80.0–100.0)
Monocytes Absolute: 1.1 10*3/uL — ABNORMAL HIGH (ref 0.1–1.0)
Monocytes Relative: 11 %
Neutro Abs: 6.5 10*3/uL (ref 1.7–7.7)
Neutrophils Relative %: 66 %
Platelets: 215 10*3/uL (ref 150–400)
RBC: 4.64 MIL/uL (ref 4.22–5.81)
RDW: 12.5 % (ref 11.5–15.5)
WBC: 9.8 10*3/uL (ref 4.0–10.5)
nRBC: 0 % (ref 0.0–0.2)

## 2022-09-09 LAB — BASIC METABOLIC PANEL
Anion gap: 7 (ref 5–15)
BUN: 9 mg/dL (ref 6–20)
CO2: 26 mmol/L (ref 22–32)
Calcium: 8.6 mg/dL — ABNORMAL LOW (ref 8.9–10.3)
Chloride: 109 mmol/L (ref 98–111)
Creatinine, Ser: 0.65 mg/dL (ref 0.61–1.24)
GFR, Estimated: >60 mL/min (ref >60)
Glucose, Bld: 72 mg/dL (ref 70–99)
Potassium: 3.4 mmol/L — ABNORMAL LOW (ref 3.5–5.1)
Sodium: 142 mmol/L (ref 135–145)

## 2022-09-09 MED ORDER — CEFTRIAXONE SODIUM 500 MG IJ SOLR: 500.0000 mg | Freq: Once | INTRAMUSCULAR | Status: DC

## 2022-09-09 NOTE — ED Notes (Signed)
Pt not in waitign area x 1

## 2022-09-09 NOTE — ED Notes (Signed)
Pt called on phone again and not answering. Will give information to CHarge RN to try later

## 2022-09-09 NOTE — ED Triage Notes (Signed)
Pt  states "I think I have a hernia and my breathing is bad this morning ". Pt c/o left scrotal pain and swelling x 2 days. States initially hurt to left suprapubic area and then "dropped down". Denies gu symptoms. Pt c/o sob today. No resp distress/sob noted. Pt ambulatory.

## 2022-09-09 NOTE — ED Notes (Signed)
Called pt x 2 with no answer and unable to leave message

## 2022-09-09 NOTE — ED Provider Notes (Signed)
[facility] EMERGENCY DEPARTMENT Provider Note   CSN: [id number] Arrival date & time: 09/09/22  1212     History  Chief Complaint  Patient presents with   Shortness of Breath   Hernia    Nathan Rhodes is a 61 y.o. male.   Shortness of Breath Associated symptoms: cough   Associated symptoms: no abdominal pain, no chest pain, no fever, no headaches, no rash, no vomiting and no wheezing        Nathan Rhodes is a 61 y.o. male with past medical history of hypertension and COPD who presents to the Emergency Department complaining of left groin and testicle pain with swelling.  Symptoms present for 2 days.  He noticed pain to his left lower abdomen while having intercourse and straining.  States he felt "a knot" to his groin area that has gradually descended into his left testicle.  He notes increasing pain and swelling with a "hard knot" of his left scrotum.  Denies any hematuria or dysuria.  No flank pain.  No nausea vomiting fever or chills.  He also notes having shortness of breath and cough this morning.  Symptoms have improved since onset.  States he typically uses albuterol inhaler but has ran out of his medication.  Does not currently have a PCP.  Denies any chest pain or shortness of breath at this time.  He is requesting an albuterol inhaler.  Home Medications Prior to Admission medications   Medication Sig Start Date End Date Taking? Authorizing Provider  ibuprofen (ADVIL) 800 MG tablet Take 1 tablet (800 mg total) by mouth every 8 (eight) hours as needed for moderate pain. Patient not taking: No sig reported 12/31/19   Domenic Moras, PA-C  oxyCODONE (ROXICODONE) 5 MG immediate release tablet Take 1 tablet (5 mg total) by mouth every 4 (four) hours as needed for severe pain. Patient not taking: No sig reported 07/17/18   Hayden Rasmussen, MD      Allergies    Tylenol [acetaminophen]    Review of Systems   Review of Systems  Constitutional:  Negative for appetite  change, chills and fever.  Respiratory:  Positive for cough and shortness of breath. Negative for wheezing.   Cardiovascular:  Negative for chest pain, palpitations and leg swelling.  Gastrointestinal:  Negative for abdominal pain, nausea and vomiting.  Genitourinary:  Positive for scrotal swelling and testicular pain. Negative for decreased urine volume, difficulty urinating, dysuria, flank pain, penile discharge, penile pain and penile swelling.  Musculoskeletal:  Negative for back pain.  Skin:  Negative for color change and rash.  Neurological:  Negative for weakness, numbness and headaches.    Physical Exam Updated Vital Signs BP (!) 179/108 (BP Location: Left Arm)   Pulse 86   Temp 99.1 F (37.3 C) (Oral)   Resp 20   SpO2 99%  Physical Exam Vitals and nursing note reviewed.  Constitutional:      General: He is not in acute distress.    Appearance: Normal appearance. He is well-developed. He is not toxic-appearing.  Cardiovascular:     Rate and Rhythm: Normal rate and regular rhythm.     Pulses: Normal pulses.  Pulmonary:     Effort: Pulmonary effort is normal. No respiratory distress.     Breath sounds: Normal breath sounds. No stridor. No wheezing, rhonchi or rales.     Comments: Lung sounds clear, no increased work of breathing.  Speaking in full sentences w/o respiratory distress.  Abdominal:  General: There is no distension.     Palpations: Abdomen is soft.     Tenderness: There is no abdominal tenderness.  Genitourinary:    Comments: Pt seen initially in triage area.  GU exam deferred until pt placed in exam room Musculoskeletal:        General: Normal range of motion.  Skin:    General: Skin is warm.     Capillary Refill: Capillary refill takes less than 2 seconds.  Neurological:     General: No focal deficit present.     Mental Status: He is alert.     Sensory: No sensory deficit.     Motor: No weakness.     ED Results / Procedures / Treatments    Labs (all labs ordered are listed, but only abnormal results are displayed) Labs Reviewed  CBC WITH DIFFERENTIAL/PLATELET - Abnormal; Notable for the following components:      Result Value   Monocytes Absolute 1.1 (*)    All other components within normal limits  BASIC METABOLIC PANEL - Abnormal; Notable for the following components:   Potassium 3.4 (*)    Calcium 8.6 (*)    All other components within normal limits  URINALYSIS, ROUTINE W REFLEX MICROSCOPIC - Abnormal; Notable for the following components:   APPearance CLOUDY (*)    pH 9.0 (*)    Glucose, UA 50 (*)    Protein, ur 30 (*)    Bacteria, UA RARE (*)    All other components within normal limits  GC/CHLAMYDIA PROBE AMP ([place]) NOT AT [facility]    EKG None  Radiology US SCROTUM W/DOPPLER  Result Date: 09/09/2022 CLINICAL DATA:  Left-sided scrotal pain for 2 days. EXAM: SCROTAL ULTRASOUND DOPPLER ULTRASOUND OF THE TESTICLES TECHNIQUE: Complete ultrasound examination of the testicles, epididymis, and other scrotal structures was performed. Color and spectral Doppler ultrasound were also utilized to evaluate blood flow to the testicles. COMPARISON:  None Available. FINDINGS: Right testicle Measurements: 4.3 x 2.2 x 3.4 cm. No mass or microlithiasis visualized. Left testicle Measurements: 4.4 x 2.4 x 3.0 cm. No mass or microlithiasis visualized. Right epididymis:  A benign epididymal cyst measures 5 x 6 x 13 mm. Left epididymis: The left epididymis is mildly enlarged and hyperemic. Hydrocele:  A complex left hydrocele measures 5.9 x 3.3 x 4.8 cm. Varicocele:  None visualized. Pulsed Doppler interrogation of both testes demonstrates normal low resistance arterial and venous waveforms bilaterally. IMPRESSION: 1. The left epididymis is mildly enlarged and hyperemic, consistent with epididymitis. 2. Complex left hydrocele measures 5.9 x 3.3 x 4.8 cm. Infection is not excluded. 3. No evidence of testicular torsion. Electronically  Signed   By: Marin Roberts M.D.   On: 09/09/2022 14:08   DG Chest 2 View  Result Date: 09/09/2022 CLINICAL DATA:  Shortness of breath starting today. EXAM: CHEST - 2 VIEW COMPARISON:  Chest radiograph 02/16/2022 FINDINGS: The cardiomediastinal silhouette is normal There is no focal consolidation or pulmonary edema. There is no pleural effusion or pneumothorax There is no acute osseous abnormality. IMPRESSION: No radiographic evidence of acute cardiopulmonary process. Electronically Signed   By: Lesia Hausen M.D.   On: 09/09/2022 12:56    Procedures Procedures    Medications Ordered in ED Medications  cefTRIAXone (ROCEPHIN) injection 500 mg (has no administration in time range)    ED Course/ Medical Decision Making/ A&P  Medical Decision Making Patient here for evaluation of cough, shortness of breath and pain and fullness of his left testicle.  History of COPD, states that he ran out of his inhaler recently.  Had coughing and some shortness of breath this morning upon waking but symptoms have since resolved.  He is requesting a refill of his inhaler.  He is also complained of pain of his left groin area for 2 days.  Pain began during intercourse.  Pain seem to localize to his left testicle area.  He denies any back pain, flank pain or dysuria.  No penile discharge reported.  Differential would include but not limited to viral illness, exacerbation of COPD, pneumonia.  Differential of his groin symptoms would include testicular torsion, hydrocele/varicocele, inguinal hernia, scrotal abscess, epididymitis  Amount and/or Complexity of Data Reviewed Labs: ordered.    Details: Labs interpreted by me, no evidence of leukocytosis, chemistries unremarkable, urinalysis shows cloudy urine with out evidence of infection.  GC chlamydia culture pending. Radiology: ordered.    Details: Scrotal ultrasound with Doppler shows left epididymis mildly enlarged and hyperemic  consistent with epididymitis.  Complex left hydrocele as well 6 x 3 x 5 cm, infection not excluded.  No evidence of testicular torsion  Chest x-ray without acute cardiopulmonary findings   Discussion of management or test interpretation with external provider(s): Patient here with left groin and testicle pain x2 days.  Pain began after having intercourse.  States he feels a knot in his left testicle.  No dysuria or penile discharge reported.  He was initially seen in vertical area.  Labs and ultrasound were ordered by me.  GU exam deferred since patient was seen in triage area.  I was notified by nursing staff that patient was called to exam room and he was no longer in the waiting area.  Antibiotic was ordered.  I requested that nursing contact patient to have him return to the ER for treatment. Patient has been called x2 with no answer and unable to leave message. Charge nurse informed and will continue to attempt to reach him to come back for treatment.   Risk Prescription drug management.           Final Clinical Impression(s) / ED Diagnoses Final diagnoses:  Acute epididymitis    Rx / DC Orders ED Discharge Orders     None         Pauline Aus, PA-C 09/09/22 Caleb Popp, MD 09/09/22 2132

## 2022-09-10 ENCOUNTER — Emergency Department (HOSPITAL_COMMUNITY)
Admission: EM | Admit: 2022-09-10 | Discharge: 2022-09-10 | Disposition: A | Discharge status: Home / Self Care | Payer: Commercial Managed Care - HMO | Attending: Emergency Medicine | Admitting: Emergency Medicine

## 2022-09-10 DIAGNOSIS — J449 Chronic obstructive pulmonary disease, unspecified: Secondary | ICD-10-CM | POA: Insufficient documentation

## 2022-09-10 DIAGNOSIS — I10 Essential (primary) hypertension: Secondary | ICD-10-CM | POA: Diagnosis not present

## 2022-09-10 DIAGNOSIS — Z760 Encounter for issue of repeat prescription: Secondary | ICD-10-CM | POA: Insufficient documentation

## 2022-09-10 DIAGNOSIS — N451 Epididymitis: Secondary | ICD-10-CM | POA: Insufficient documentation

## 2022-09-10 DIAGNOSIS — N50812 Left testicular pain: Secondary | ICD-10-CM | POA: Diagnosis present

## 2022-09-10 LAB — GC/CHLAMYDIA PROBE AMP (~~LOC~~) NOT AT ARMC
Chlamydia: NEGATIVE
Comment: NEGATIVE
Comment: NORMAL
Neisseria Gonorrhea: NEGATIVE

## 2022-09-10 MED ORDER — ALBUTEROL SULFATE HFA 108 (90 BASE) MCG/ACT IN AERS: 1.0000 | INHALATION_SPRAY | Freq: Four times a day (QID) | RESPIRATORY_TRACT | 0 refills | Status: DC | PRN

## 2022-09-10 MED ORDER — SULFAMETHOXAZOLE-TRIMETHOPRIM 800-160 MG PO TABS
1.0000 | ORAL_TABLET | Freq: Once | ORAL | Status: AC
Start: 2022-09-10 — End: 2022-09-10
  Administered 2022-09-10: 1 via ORAL
  Filled 2022-09-10: qty 1

## 2022-09-10 MED ORDER — OXYCODONE HCL 5 MG PO TABS
5.0000 mg | ORAL_TABLET | Freq: Once | ORAL | Status: AC
  Administered 2022-09-10: 5 mg via ORAL
  Filled 2022-09-10: qty 1

## 2022-09-10 MED ORDER — SULFAMETHOXAZOLE-TRIMETHOPRIM 800-160 MG PO TABS: 1.0000 | ORAL_TABLET | Freq: Two times a day (BID) | ORAL | 0 refills | Status: AC

## 2022-09-10 NOTE — Discharge Instructions (Signed)
Take the full course of the medication prescribed for your infection.  I have also given you a refill on your albuterol inhaler.  Uses as needed as instructed.  As discussed your blood pressure is elevated today and you are not doing your body any favors by ignoring an elevated blood pressure.  I do recommend you establish care with a primary doctor for follow-up care of this.  I recommend having a repeat blood pressure within a week.

## 2022-09-10 NOTE — ED Triage Notes (Signed)
Seen yesterday but left prior to receiving antibiotics. Nad. No changes.

## 2022-09-12 NOTE — ED Provider Notes (Signed)
[facility] EMERGENCY DEPARTMENT Provider Note   CSN: [id number] Arrival date & time: 09/10/22  1001     History  Chief Complaint  Patient presents with   Groin Swelling    Nathan Rhodes is a 61 y.o. male with a history including COPD and untreated hypertension presenting with 3-day history of left groin and testicle pain and swelling.  He was having intercourse when he noticed pain in his left lower abdomen which he also felt a "knot" in his groin area that then moved down into his left testicle region.  He denies flank pain, dysuria, hematuria, fevers, nausea or vomiting.  He actually came in yesterday for this problem and had an ultrasound completed of his scrotum but eloped without any treatment completed.  He denies penile discharge, unwilling to confirm or deny risk for STDs.  He also describes having increased episodes of coughing shortness of breath and wheezing since he ran out of his albuterol inhaler, he last had the symptoms yesterday and is asking for an albuterol inhaler prescription. The history is provided by the patient.       Home Medications Prior to Admission medications   Medication Sig Start Date End Date Taking? Authorizing Provider  albuterol (VENTOLIN HFA) 108 (90 Base) MCG/ACT inhaler Inhale 1-2 puffs into the lungs every 6 (six) hours as needed for wheezing or shortness of breath. 09/10/22  Yes Detrich Rakestraw, Almyra Free, PA-C  sulfamethoxazole-trimethoprim (BACTRIM DS) 800-160 MG tablet Take 1 tablet by mouth 2 (two) times daily for 10 days. 09/10/22 09/20/22 Yes Jhania Etherington, Almyra Free, PA-C  ibuprofen (ADVIL) 800 MG tablet Take 1 tablet (800 mg total) by mouth every 8 (eight) hours as needed for moderate pain. Patient not taking: Reported on 11/13/2020 12/31/19   Domenic Moras, PA-C  oxyCODONE (ROXICODONE) 5 MG immediate release tablet Take 1 tablet (5 mg total) by mouth every 4 (four) hours as needed for severe pain. Patient not taking: Reported on 11/13/2020 07/17/18   Hayden Rasmussen, MD      Allergies    Tylenol [acetaminophen]    Review of Systems   Review of Systems  Constitutional:  Negative for chills and fever.  HENT: Negative.  Negative for congestion.   Eyes: Negative.   Respiratory:  Positive for shortness of breath and wheezing. Negative for chest tightness.   Cardiovascular:  Negative for chest pain and leg swelling.  Gastrointestinal:  Negative for abdominal pain, nausea and vomiting.  Genitourinary:  Positive for scrotal swelling and testicular pain. Negative for dysuria, frequency and penile discharge.  Musculoskeletal:  Negative for arthralgias, joint swelling and neck pain.  Skin: Negative.  Negative for rash and wound.  Neurological:  Negative for dizziness, weakness, light-headedness, numbness and headaches.  Psychiatric/Behavioral: Negative.      Physical Exam Updated Vital Signs BP (!) 208/96 (BP Location: Left Arm)   Pulse 62   Temp 98.1 F (36.7 C) (Oral)   Resp 16   SpO2 99%  Physical Exam Vitals and nursing note reviewed. Exam conducted with a chaperone present.  Constitutional:      Appearance: He is well-developed.  HENT:     Head: Normocephalic and atraumatic.  Eyes:     Conjunctiva/sclera: Conjunctivae normal.  Cardiovascular:     Rate and Rhythm: Normal rate and regular rhythm.     Heart sounds: Normal heart sounds.  Pulmonary:     Effort: Pulmonary effort is normal. No respiratory distress.     Breath sounds: Normal breath sounds. No wheezing or  rhonchi.     Comments: Normal pulmonary exam, no respiratory distress at present. Abdominal:     General: Bowel sounds are normal.     Palpations: Abdomen is soft.     Tenderness: There is no abdominal tenderness.     Hernia: There is no hernia in the left inguinal area.  Genitourinary:    Penis: Normal. No discharge.      Testes:        Left: Tenderness and swelling present.     Epididymis:     Left: Tenderness present.  Musculoskeletal:        General:  Normal range of motion.     Cervical back: Normal range of motion.  Skin:    General: Skin is warm and dry.  Neurological:     Mental Status: He is alert.     ED Results / Procedures / Treatments   Labs (all labs ordered are listed, but only abnormal results are displayed) Labs Reviewed - No data to display  EKG None  Radiology No results found.  Procedures Procedures    Medications Ordered in ED Medications  oxyCODONE (Oxy IR/ROXICODONE) immediate release tablet 5 mg (5 mg Oral Given 09/10/22 1141)  sulfamethoxazole-trimethoprim (BACTRIM DS) 800-160 MG per tablet 1 tablet (1 tablet Oral Given 09/10/22 1307)    ED Course/ Medical Decision Making/ A&P Clinical Course as of 09/12/22 2118  Wed Sep 10, 2022  1159 Testicular pain. 3 days ago he had sudden onset during sexual intercourse. Left pelvic pain.   [CC]    Clinical Course User Index [CC] Tretha Sciara, MD                           Medical Decision Making Patient's exam, history and yesterday's scrotal ultrasound confirming epididymitis.  He is placed on Bactrim for this condition.  He has no respiratory distress currently and has a normal lung exam, no indication for chest x-ray at this time, however he was given a refill of his albuterol inhaler.  We discussed his high blood pressure and the importance of keeping blood pressure log pressure under control and severe complications of uncontrolled blood pressure.  He stated his blood pressure "is always high" and he is not interested in any medicines for this problem.  He denies chest pain, shortness of breath, headache, vision changes.  Patient is given referrals to urology and suggestions for obtaining primary care as well.  Amount and/or Complexity of Data Reviewed Labs: ordered.    Details: Labs obtained yesterday including GC and chlamydia, urinalysis, CT BCNU being met.  Unremarkable findings, not repeated today.  GC and Chlamydia cultures are still  pending at time of discharge.  Risk Prescription drug management.           Final Clinical Impression(s) / ED Diagnoses Final diagnoses:  Medication refill  Epididymitis  Hypertension, unspecified type    Rx / DC Orders ED Discharge Orders          Ordered    albuterol (VENTOLIN HFA) 108 (90 Base) MCG/ACT inhaler  Every 6 hours PRN        09/10/22 1235    sulfamethoxazole-trimethoprim (BACTRIM DS) 800-160 MG tablet  2 times daily        09/10/22 1235              Evalee Jefferson, Hershal Coria 09/12/22 2120    Tretha Sciara, MD 09/13/22 [phone]

## 2022-11-25 ENCOUNTER — Ambulatory Visit: Payer: Commercial Managed Care - HMO | Admitting: Internal Medicine

## 2023-09-15 ENCOUNTER — Other Ambulatory Visit: Payer: Self-pay

## 2023-09-15 NOTE — Congregational Nurse Program (Unsigned)
This gentlemen presented to the soup kitchen asking for blood pressure to be checked. His blood pressure was elevated to 200/100 in left arm and 200/110 in right arm. I did explained the danger of stroke.I also explained he need to go yo his PCP today or urgent care for follow-up. He stated would go, he did sign the screening sheet and left the office.

## 2023-10-22 ENCOUNTER — Other Ambulatory Visit: Payer: Self-pay

## 2023-10-22 NOTE — Congregational Nurse Program (Unsigned)
  Dept: [phone]   Congregational Nurse Program Note  Date of Encounter: 10/22/2023  Past Medical History: Past Medical History:  Diagnosis Date   COPD (chronic obstructive pulmonary disease) (HCC)    Hypertension     Encounter Details:

## 2023-10-22 NOTE — Congregational Nurse Program (Unsigned)
  Dept: [phone]   Congregational Nurse Program Note  Date of Encounter: 10/22/2023  Past Medical History: Past Medical History:  Diagnosis Date   COPD (chronic obstructive pulmonary disease) (HCC)    Hypertension     Encounter Details:  Community Questionnaire - 10/22/23 1149       Questionnaire   Student Assistance N/A    Location Patient Served  Teacher, adult education    Encounter Setting CN site    Population Status Unhoused   Staying with girlfriend   Insurance Medicaid    Insurance/Financial Assistance Referral N/A    Medication Have Medication Insecurities    Medical Provider No    Screening Referrals Made --   Reffered to ER   Medical Referrals Made ED    Medical Appointment Completed ED    CNP Interventions Counsel;Educate    Screenings CN Performed Blood Pressure    Life-Saving Intervention Made Yes

## 2023-10-22 NOTE — Congregational Nurse Program (Unsigned)
This morning, Nathan Rhodes visited the clinic to have his blood pressure checked. Upon measurement, his readings were significantly high, with values of 234/112 in the left arm and 184/108 in the right arm. Given the severity of his condition and his history of ongoing hypertension, I recommended that he go to the emergency department for immediate evaluation. During our discussion, I took the opportunity to educate him about hypertension and emphasized the importance of adhering to prescribed medication. Nathan Rhodes acknowledged the situation and confirmed that he would proceed to the emergency department. Salem Senate RN MSN DNP  Congregational Nursing Program [facility]

## 2024-01-04 ENCOUNTER — Other Ambulatory Visit: Payer: Self-pay

## 2024-01-04 ENCOUNTER — Emergency Department (HOSPITAL_COMMUNITY)
Admission: EM | Admit: 2024-01-04 | Discharge: 2024-01-04 | Disposition: A | Discharge status: Home / Self Care | Payer: Medicaid Other | Attending: Emergency Medicine | Admitting: Emergency Medicine

## 2024-01-04 ENCOUNTER — Emergency Department (HOSPITAL_COMMUNITY): Payer: Medicaid Other

## 2024-01-04 ENCOUNTER — Encounter (HOSPITAL_COMMUNITY): Payer: Self-pay | Admitting: Emergency Medicine

## 2024-01-04 DIAGNOSIS — I10 Essential (primary) hypertension: Secondary | ICD-10-CM | POA: Diagnosis not present

## 2024-01-04 DIAGNOSIS — Z79899 Other long term (current) drug therapy: Secondary | ICD-10-CM | POA: Diagnosis not present

## 2024-01-04 DIAGNOSIS — R0602 Shortness of breath: Secondary | ICD-10-CM | POA: Diagnosis present

## 2024-01-04 DIAGNOSIS — B349 Viral infection, unspecified: Secondary | ICD-10-CM | POA: Insufficient documentation

## 2024-01-04 LAB — CBC
HCT: 50.2 % (ref 39.0–52.0)
Hemoglobin: 17 g/dL (ref 13.0–17.0)
MCH: 31.3 pg (ref 26.0–34.0)
MCHC: 33.9 g/dL (ref 30.0–36.0)
MCV: 92.3 fL (ref 80.0–100.0)
Platelets: 248 10*3/uL (ref 150–400)
RBC: 5.44 MIL/uL (ref 4.22–5.81)
RDW: 12.1 % (ref 11.5–15.5)
WBC: 8.3 10*3/uL (ref 4.0–10.5)
nRBC: 0 % (ref 0.0–0.2)

## 2024-01-04 LAB — COMPREHENSIVE METABOLIC PANEL
ALT: 18 U/L (ref 0–44)
AST: 16 U/L (ref 15–41)
Albumin: 4.5 g/dL (ref 3.5–5.0)
Alkaline Phosphatase: 80 U/L (ref 38–126)
Anion gap: 10 (ref 5–15)
BUN: 18 mg/dL (ref 8–23)
CO2: 28 mmol/L (ref 22–32)
Calcium: 10.1 mg/dL (ref 8.9–10.3)
Chloride: 104 mmol/L (ref 98–111)
Creatinine, Ser: 1.13 mg/dL (ref 0.61–1.24)
GFR, Estimated: >60 mL/min (ref >60)
Glucose, Bld: 96 mg/dL (ref 70–99)
Potassium: 3.8 mmol/L (ref 3.5–5.1)
Sodium: 142 mmol/L (ref 135–145)
Total Bilirubin: 0.5 mg/dL (ref 0.0–1.2)
Total Protein: 7.8 g/dL (ref 6.5–8.1)

## 2024-01-04 LAB — RESP PANEL BY RT-PCR (RSV, FLU A&B, COVID)  RVPGX2
Influenza A by PCR: NEGATIVE
Influenza B by PCR: NEGATIVE
Resp Syncytial Virus by PCR: NEGATIVE
SARS Coronavirus 2 by RT PCR: NEGATIVE

## 2024-01-04 LAB — TROPONIN I (HIGH SENSITIVITY): Troponin I (High Sensitivity): 12 ng/L (ref <18)

## 2024-01-04 LAB — BRAIN NATRIURETIC PEPTIDE: B Natriuretic Peptide: 114 pg/mL — ABNORMAL HIGH (ref 0.0–100.0)

## 2024-01-04 MED ORDER — LABETALOL HCL 100 MG PO TABS: 100.0000 mg | ORAL_TABLET | Freq: Two times a day (BID) | ORAL | 2 refills | Status: DC

## 2024-01-04 MED ORDER — LABETALOL HCL 5 MG/ML IV SOLN
20.0000 mg | Freq: Once | INTRAVENOUS | Status: AC
  Administered 2024-01-04: 20 mg via INTRAVENOUS
  Filled 2024-01-04: qty 4

## 2024-01-04 NOTE — ED Provider Notes (Signed)
[place] EMERGENCY DEPARTMENT AT [facility] Provider Note   CSN: [id number] Arrival date & time: 01/04/24  2021     History {Add pertinent medical, surgical, social history, OB history to HPI:1} Chief Complaint  Patient presents with   Shortness of Breath    Nathan Rhodes is a 63 y.o. male.  Patient complains of bodyaches and chills.   Weakness      Home Medications Prior to Admission medications   Medication Sig Start Date End Date Taking? Authorizing Provider  labetalol (NORMODYNE) 100 MG tablet Take 1 tablet (100 mg total) by mouth 2 (two) times daily. 01/04/24  Yes Bethann Berkshire, MD  lisinopril (ZESTRIL) 20 MG tablet Take 20 mg by mouth daily. 11/27/23  Yes [provider]  albuterol (VENTOLIN HFA) 108 (90 Base) MCG/ACT inhaler Inhale 1-2 puffs into the lungs every 6 (six) hours as needed for wheezing or shortness of breath. 09/10/22   Idol, Raynelle Fanning, PA-C  ibuprofen (ADVIL) 800 MG tablet Take 1 tablet (800 mg total) by mouth every 8 (eight) hours as needed for moderate pain. Patient not taking: Reported on 11/13/2020 12/31/19   Fayrene Helper, PA-C  oxyCODONE (ROXICODONE) 5 MG immediate release tablet Take 1 tablet (5 mg total) by mouth every 4 (four) hours as needed for severe pain. Patient not taking: Reported on 11/13/2020 07/17/18   Terrilee Files, MD      Allergies    Tylenol [acetaminophen]    Review of Systems   Review of Systems  Neurological:  Positive for weakness.    Physical Exam Updated Vital Signs BP (!) 174/103   Pulse 69   Temp 98.7 F (37.1 C) (Oral)   Resp (!) 28   Ht 5\' 6"  (1.676 m)   Wt 72.6 kg   SpO2 98%   BMI 25.83 kg/m  Physical Exam  ED Results / Procedures / Treatments   Labs (all labs ordered are listed, but only abnormal results are displayed) Labs Reviewed  BRAIN NATRIURETIC PEPTIDE - Abnormal; Notable for the following components:      Result Value   B Natriuretic Peptide 114.0 (*)    All other  components within normal limits  RESP PANEL BY RT-PCR (RSV, FLU A&B, COVID)  RVPGX2  CBC  COMPREHENSIVE METABOLIC PANEL  TROPONIN I (HIGH SENSITIVITY)  TROPONIN I (HIGH SENSITIVITY)    EKG None  Radiology DG Chest 2 View Result Date: 01/04/2024 CLINICAL DATA:  Shortness of breath.  History of COPD. EXAM: CHEST - 2 VIEW COMPARISON:  09/09/2022 FINDINGS: Chronic hyperinflation and bronchial thickening. No focal airspace disease. Normal heart size with unchanged mediastinal contours, mild aortic tortuosity. No pleural effusion, pneumothorax or pulmonary edema. No acute osseous findings. IMPRESSION: Chronic hyperinflation and bronchial thickening, imaging findings consistent with COPD. No focal airspace disease. Electronically Signed   By: Narda Rutherford M.D.   On: 01/04/2024 22:00    Procedures Procedures  {Document cardiac monitor, telemetry assessment procedure when appropriate:1}  Medications Ordered in ED Medications  labetalol (NORMODYNE) injection 20 mg (20 mg Intravenous Given 01/04/24 2153)    ED Course/ Medical Decision Making/ A&P   {   Click here for ABCD2, HEART and other calculatorsREFRESH Note before signing :1}                              Medical Decision Making Amount and/or Complexity of Data Reviewed Labs: ordered. Radiology: ordered.  Risk Prescription drug management.  Patient with hypertension that responded to labetalol.  And viral syndrome.  He will follow-up with PCP in the next week and is put on labetalol  {Document critical care time when appropriate:1} {Document review of labs and clinical decision tools ie heart score, Chads2Vasc2 etc:1}  {Document your independent review of radiology images, and any outside records:1} {Document your discussion with family members, caretakers, and with consultants:1} {Document social determinants of health affecting pt's care:1} {Document your decision making why or why not admission, treatments were  needed:1} Final Clinical Impression(s) / ED Diagnoses Final diagnoses:  Viral syndrome  Primary hypertension    Rx / DC Orders ED Discharge Orders          Ordered    labetalol (NORMODYNE) 100 MG tablet  2 times daily        01/04/24 2246

## 2024-01-04 NOTE — Discharge Instructions (Signed)
Drink plenty of fluids and rest for couple days.  Follow-up with your doctor in the next week for your blood pressure.

## 2024-01-04 NOTE — ED Triage Notes (Addendum)
Pt arrives POV c/o chills, body aches, and some increased shortness of breath since yesterday.

## 2024-01-05 ENCOUNTER — Telehealth (HOSPITAL_COMMUNITY): Payer: Self-pay | Admitting: Emergency Medicine

## 2024-01-05 MED ORDER — LABETALOL HCL 100 MG PO TABS: 100.0000 mg | ORAL_TABLET | Freq: Two times a day (BID) | ORAL | 2 refills | Status: AC

## 2024-01-05 NOTE — Telephone Encounter (Cosign Needed)
   Patient seen here 01/04/2024 prescription for labetalol 100 mg twice daily written to pharmacy of record.  Congregational nursing contacted the department requesting the patient's prescription be sent to another pharmacy.  Prescription changed to [facility]

## 2024-01-05 NOTE — Congregational Nurse Program (Unsigned)
Nathan Rhodes presented for a blood pressure check after visiting the emergency department the previous day due to complaints of dizziness. He mentioned that he did not have the funds to purchase the prescribed medication. Upon assessment, it was noted that his blood pressure is elevated. I communicated with the charge nurse in the emergency department regarding the prescription. I informed [name] that his medication would be available at [facility] later today, and he confirmed that he would pick it up.  Salem Senate RN MSN DNP

## 2024-10-18 ENCOUNTER — Ambulatory Visit

## 2024-10-18 ENCOUNTER — Other Ambulatory Visit (HOSPITAL_COMMUNITY): Payer: Self-pay

## 2024-10-18 VITALS — BP 118/92 | HR 83 | Temp 98.2°F | Ht 66.0 in | Wt 152.2 lb

## 2024-10-18 DIAGNOSIS — Z13220 Encounter for screening for lipoid disorders: Secondary | ICD-10-CM | POA: Diagnosis not present

## 2024-10-18 DIAGNOSIS — Z716 Tobacco abuse counseling: Secondary | ICD-10-CM | POA: Diagnosis not present

## 2024-10-18 DIAGNOSIS — Z113 Encounter for screening for infections with a predominantly sexual mode of transmission: Secondary | ICD-10-CM

## 2024-10-18 DIAGNOSIS — Z79899 Other long term (current) drug therapy: Secondary | ICD-10-CM

## 2024-10-18 DIAGNOSIS — I739 Peripheral vascular disease, unspecified: Secondary | ICD-10-CM

## 2024-10-18 DIAGNOSIS — Z8679 Personal history of other diseases of the circulatory system: Secondary | ICD-10-CM

## 2024-10-18 DIAGNOSIS — J449 Chronic obstructive pulmonary disease, unspecified: Secondary | ICD-10-CM

## 2024-10-18 DIAGNOSIS — Z1211 Encounter for screening for malignant neoplasm of colon: Secondary | ICD-10-CM

## 2024-10-18 DIAGNOSIS — Z0001 Encounter for general adult medical examination with abnormal findings: Secondary | ICD-10-CM

## 2024-10-18 DIAGNOSIS — Z122 Encounter for screening for malignant neoplasm of respiratory organs: Secondary | ICD-10-CM

## 2024-10-18 DIAGNOSIS — Z7689 Persons encountering health services in other specified circumstances: Secondary | ICD-10-CM

## 2024-10-18 DIAGNOSIS — Z0000 Encounter for general adult medical examination without abnormal findings: Secondary | ICD-10-CM

## 2024-10-18 MED ORDER — ALBUTEROL SULFATE HFA 108 (90 BASE) MCG/ACT IN AERS: 1.0000 | INHALATION_SPRAY | Freq: Four times a day (QID) | RESPIRATORY_TRACT | 1 refills | Status: AC | PRN

## 2024-10-18 MED ORDER — BUDESONIDE-FORMOTEROL FUMARATE 160-4.5 MCG/ACT IN AERO: 2.0000 | INHALATION_SPRAY | Freq: Two times a day (BID) | RESPIRATORY_TRACT | 1 refills | Status: AC

## 2024-10-18 MED ORDER — CILOSTAZOL 100 MG PO TABS: 100.0000 mg | ORAL_TABLET | Freq: Two times a day (BID) | ORAL | 0 refills | Status: AC

## 2024-10-18 NOTE — Progress Notes (Addendum)
 "   New Patient Office Visit  Subjective    Patient ID: Nathan Rhodes, male    DOB: 01/14/1961  Age: 63 y.o. MRN: [id number]  CC:  Chief Complaint  Patient presents with   New Patient (Initial Visit)    Patient is a new patient Patient is having breathing issues. Goes to the hospital for inhaler or breathing treatment Patient is also experiencing memory loss for 2 years now. Patient is also experiencing leg/foot/toe pain. He states he can barely walk but a few feet.    HPI [name] presents to establish care.   The patient comes in today for a wellness visit.  A review of their health history was completed. A review of medications was also completed.  Any needed refills; Needs a refill on his albuterol  inhaler.   Eating habits: Good, eats two home cooked meals per day  Falls/  MVA accidents in past few months: No  Regular exercise: Tries to walk as much as possible, but has leg pain with walking. Not able to do much.   Sleep: Good  Sexual history:  Not currently sexually active.  Specialist pt sees on regular basis: None  Regular eye/dental exams: No  Preventative health issues were discussed. Need colonoscopy and lung cancer screening.  Additional concerns: Endorses leg pain with walking. Says that he starts to walk and begins to have pain in the back of his calves bilaterally, and has to rest to relieve pain. Says that wearing shoes aggravates the pain, so he bought bigger shoes. Reports that weather does not affect the pain. Has tried Ibuprofen  with no relief. Rest is the only thing that relieves his pain.  Would like a refill on his albuterol  inhaler. Does not use it daily, but has recent ED visits when his inhaler has been out. Has not seen a pulmonologist for his COPD. Reports that he still continues to smoke 1/2 pack a day. Has decreased his smoking over the years. Reports that he has been smoking since he was 63 years old. Not ready to completely quit at  this time, but has decreased his amount. Is able to do activities as tolerated, and is not limited due to his breathing.   Patient reports that he has a history of hypertension, and was prescribed medication but did not pick it up due to cost. Denies blurred vision or headaches. Does not have a blood pressure monitor at home.   Outpatient Encounter Medications as of 10/18/2024  Medication Sig   albuterol  (VENTOLIN  HFA) 108 (90 Base) MCG/ACT inhaler Inhale 1-2 puffs into the lungs every 6 (six) hours as needed for wheezing or shortness of breath.   ibuprofen  (ADVIL ) 800 MG tablet Take 1 tablet (800 mg total) by mouth every 8 (eight) hours as needed for moderate pain. (Patient not taking: Reported on 11/13/2020)   labetalol  (NORMODYNE ) 100 MG tablet Take 1 tablet (100 mg total) by mouth 2 (two) times daily.   lisinopril (ZESTRIL) 20 MG tablet Take 20 mg by mouth daily.   oxyCODONE  (ROXICODONE ) 5 MG immediate release tablet Take 1 tablet (5 mg total) by mouth every 4 (four) hours as needed for severe pain. (Patient not taking: Reported on 11/13/2020)   No facility-administered encounter medications on file as of 10/18/2024.    Past Medical History:  Diagnosis Date   COPD (chronic obstructive pulmonary disease) (HCC)    Hypertension     No past surgical history on file.  Family History  Problem Relation  Age of Onset   Hypertension Mother    Diabetes Mother    Diabetes Other     Social History   Socioeconomic History   Marital status: Single    Spouse name: Not on file   Number of children: Not on file   Years of education: Not on file   Highest education level: Not on file  Occupational History   Not on file  Tobacco Use   Smoking status: Former    Current packs/day: 1.00    Average packs/day: 1 pack/day for 20.0 years (20.0 ttl pk-yrs)    Types: Cigarettes   Smokeless tobacco: Never  Vaping Use   Vaping status: Never Used  Substance and Sexual Activity   Alcohol use: No    Drug use: No   Sexual activity: Never  Other Topics Concern   Not on file  Social History Narrative   Not on file   Social Drivers of Health   Financial Resource Strain: Not on file  Food Insecurity: Not on file  Transportation Needs: Not on file  Physical Activity: Not on file  Stress: Not on file  Social Connections: Not on file  Intimate Partner Violence: Not on file   Review of Systems  Constitutional:  Negative for chills, fever, malaise/fatigue and weight loss.  HENT:  Negative for sore throat.   Eyes:  Negative for blurred vision.  Respiratory:  Negative for cough, shortness of breath and wheezing.   Cardiovascular:  Positive for claudication. Negative for chest pain and leg swelling.  Gastrointestinal:  Negative for abdominal pain, constipation, diarrhea, nausea and vomiting.  Genitourinary:  Negative for dysuria.  Neurological:  Negative for dizziness, weakness and headaches.  Psychiatric/Behavioral:  The patient is not nervous/anxious.    Objective    BP (!) 118/92 (BP Location: Left Arm, Patient Position: Sitting)   Pulse 83   Temp 98.2 F (36.8 C)   Ht 5' 6 (1.676 m)   Wt 152 lb 4 oz (69.1 kg)   SpO2 90%   BMI 24.57 kg/m   Physical Exam Vitals and nursing note reviewed.  Constitutional:      General: He is not in acute distress.    Appearance: Normal appearance. He is normal weight. He is not ill-appearing.  Cardiovascular:     Rate and Rhythm: Normal rate and regular rhythm.     Pulses:          Dorsalis pedis pulses are 1+ on the right side and 1+ on the left side.     Heart sounds: Normal heart sounds, S1 normal and S2 normal. No murmur heard. Pulmonary:     Effort: Pulmonary effort is normal. No respiratory distress.     Breath sounds: Normal breath sounds.     Comments: Mild expiratory wheezes bilaterally at the lung bases.  Abdominal:     General: Abdomen is flat. There is no distension.     Palpations: Abdomen is soft.     Tenderness: There  is no abdominal tenderness.  Musculoskeletal:     Cervical back: No torticollis.     Thoracic back: No scoliosis.     Lumbar back: No scoliosis.     Right lower leg: No edema.     Left lower leg: No edema.  Lymphadenopathy:     Cervical: No cervical adenopathy.  Skin:    Capillary Refill: Capillary refill takes more than 3 seconds. Capillary refill 4 seconds on bilateral toes.     Comments: Blanchable erythema noted on  bilateral toes. Feet cool to touch. No decrease in hair growth on legs bilaterally.   Neurological:     Mental Status: He is alert.     Coordination: Romberg sign negative.  Psychiatric:        Mood and Affect: Mood normal.        Behavior: Behavior normal.        Thought Content: Thought content normal.        Judgment: Judgment normal.       10/18/2024    1:55 PM  Depression screen PHQ 2/9  Decreased Interest 1  Down, Depressed, Hopeless 2  PHQ - 2 Score 3  Altered sleeping 0  Tired, decreased energy 1  Change in appetite 1  Feeling bad or failure about yourself  1  Moving slowly or fidgety/restless 2  Suicidal thoughts 0  PHQ-9 Score 8       10/18/2024   12:58 PM  GAD 7 : Generalized Anxiety Score  Nervous, Anxious, on Edge 1  Control/stop worrying 2  Worry too much - different things 1  Trouble relaxing 1  Restless 2  Easily annoyed or irritable 2  Afraid - awful might happen 1  Total GAD 7 Score 10  Anxiety Difficulty Somewhat difficult   Assessment & Plan:  1. Encounter to establish care (Primary)  2. Annual wellness visit Adult wellness-complete.wellness physical was conducted today. Importance of diet and exercise were discussed in detail.  Importance of stress reduction and healthy living were discussed.  In addition to this a discussion regarding safety was also covered.  We also reviewed over immunizations and gave recommendations regarding current immunization needed for age.   In addition to this additional areas were also touched  on including: Preventative health exams needed:  Colonoscopy, Lung cancer screening  Patient was advised yearly wellness exam   - Comprehensive metabolic panel with GFR  3. Intermittent claudication -Discussed with patient that this is a sign of peripheral artery disease and increases the patient's risk of heart attack and stroke, so treatment is important.  -Advised patient on smoking cessation, and discussed that the most important thing that we can do to treat this is smoking cessation.  -Educated patient about a structured walking program. Patient agrees to attempt this after starting medication to help with pain.  -Discussed potential side effects of cilostazol  including bleeding risks, and to monitor for any increased bleeding. May take 2 to 4 weeks to notice improvement in symptoms. Seek immediate medical attention if experiencing any signs of an allergic reaction.  - cilostazol  (PLETAL ) 100 MG tablet; Take 1 tablet (100 mg total) by mouth 2 (two) times daily.  Dispense: 60 tablet; Refill: 0 - US  ARTERIAL ABI (SCREENING LOWER EXTREMITY)  4. History of hypertension -Due to patient's blood pressure being within normal range and absence of blurred vision or headaches, advised patient to monitor blood pressure at home, and will decide if antihypertensives need to be started at next visit.  -Provided patient with a prescription for a blood pressure monitor. Advised patient to record blood pressures for the next few weeks into a log, and bring at the next follow-up visit.   5. Chronic obstructive pulmonary disease, unspecified COPD type (HCC) -Discussed treatment plan and purpose of each inhaler to the patient. Educated patient that albuterol  is to be used on an as needed basis, and if he notices he is needing it more than two times per week that he will need be evaluated by a provider.  -  Instructed patient on proper technique use of Symbicort , and that is a maintenance medication that is to  be used daily. Advised to rinse mouth after use of Symbicort  inhaler due to increased risk of oral thrush. Advised to report worsening symptoms or medication intolerance.  -Patient has not followed up with a pulmonologist. Will recommend this to patient at next visit if treatment is not effective.  -Educated patient on influenza and pneumonia vaccines due to increased risk for hospitalization. Patient deferred at this time.  -Advised to maintain regular activity as tolerated. Gave warning signs of a COPD flare and to seek medical attention if these occur.  - Ambulatory Referral for Lung Cancer Screening - budesonide -formoterol  (SYMBICORT ) 160-4.5 MCG/ACT inhaler; Inhale 2 puffs into the lungs 2 (two) times daily.  Dispense: 1 each; Refill: 1 - albuterol  (VENTOLIN  HFA) 108 (90 Base) MCG/ACT inhaler; Inhale 1-2 puffs into the lungs every 6 (six) hours as needed for wheezing or shortness of breath.  Dispense: 1 each; Refill: 1  6. Screening for lung cancer  - Ambulatory Referral for Lung Cancer Scre  7. Screening for colon cancer  - Ambulatory referral to Gastroenterology  8. Screening for lipid disorders  - Lipid panel  9. Encounter for tobacco use cessation counseling -Discussed smoking cessation with patient, patient verbalizes that he is attempting to cut back on smoking. Advised patient that if he is ready to quit smoking, and needs help to let us  know.    10. Screening for STDs (sexually transmitted diseases)  - HIV Antibody (routine testing w rflx) - Hepatitis C Antibody   11. High risk medication use -Ordered CBC with diff to monitor for any bleeding issues, as a prescribed medication carries a risk of causing bleeding abnormalities.  - CBC with Differential   Return in about 4 weeks (around 11/15/2024).  Damien KATHEE Pringle, FNP  Addendum:  Patient was seen at [facility] on 10/17/2024. Originally entered in error.   Thanks, Damien Pringle, FNP-BC   "

## 2024-10-18 NOTE — Patient Instructions (Addendum)
 Take blood pressure once per week, if it gets over 140/90 let us  know.  Symbicort inhaler maintenance Refill Albuterol   Labs Low dose chest CT at radiology ABI at radiology Colonoscopy

## 2024-10-19 ENCOUNTER — Other Ambulatory Visit (HOSPITAL_COMMUNITY): Payer: Self-pay

## 2024-10-19 ENCOUNTER — Telehealth: Payer: Self-pay | Admitting: Pharmacy Technician

## 2024-10-19 ENCOUNTER — Encounter (INDEPENDENT_AMBULATORY_CARE_PROVIDER_SITE_OTHER): Payer: Self-pay | Admitting: *Deleted

## 2024-10-19 NOTE — Telephone Encounter (Signed)
 Pharmacy Patient Advocate Encounter  Received notification from [employer] that Prior Authorization for Cilostazol 100MG  tablets has been APPROVED from 10/19/2024 to 10/19/2025. Unable to obtain price due to refill too soon rejection, last fill date 10/19/2024 next available fill date12/26/2025.   PA #/Case ID/Reference #: [id number]

## 2024-10-19 NOTE — Telephone Encounter (Signed)
 Pharmacy Patient Advocate Encounter   Received notification from [employer]base that prior authorization for Cilostazol 100MG  tablets is required/requested.   Insurance verification completed.   The patient is insured through [employer].   Per test claim: PA required; PA submitted to above mentioned insurance via Latent Key/confirmation #/EOC [id number] Status is pending

## 2024-10-25 ENCOUNTER — Ambulatory Visit (HOSPITAL_COMMUNITY): Admission: RE | Admit: 2024-10-25 | Discharge: 2024-10-25

## 2024-10-25 ENCOUNTER — Telehealth: Payer: Self-pay

## 2024-10-25 NOTE — Telephone Encounter (Signed)
 Prescription Request  10/25/2024  LOV: Visit date not found  What is the name of the medication or equipment? oxyCODONE  (ROXICODONE ) 5 MG immediate release tablet   Have you contacted your pharmacy to request a refill? Yes   Which pharmacy would you like this sent to?   [facility]    Patient notified that their request is being sent to the clinical staff for review and that they should receive a response within 2 business days.   Please advise at Mobile [phone] (mobile)

## 2024-10-26 NOTE — Telephone Encounter (Signed)
 Left message to notify patient provider recommendation from below- may notify patient

## 2024-10-26 NOTE — Telephone Encounter (Signed)
 Cook, Jayce G, DO      10/25/24  5:03 PM I don't see anything in her not about filling this medication. It's a no for me.

## 2024-10-31 ENCOUNTER — Ambulatory Visit: Payer: Self-pay

## 2024-11-02 ENCOUNTER — Other Ambulatory Visit: Payer: Self-pay

## 2024-11-02 DIAGNOSIS — M79604 Pain in right leg: Secondary | ICD-10-CM

## 2024-11-02 NOTE — Progress Notes (Signed)
 Let the patient know that his arterial brachial ultrasound was consistent with bilateral peripheral artery disease. I discussed this with him in our last visit. He is having narrowing of the blood vessels going down his legs, which is causing his leg pain bilaterally. This is due to lack of blood circulation to his extremities. He is at an increased risk for heart attack, stroke, non-healing wounds or infections if left untreated. PAD is manageable, but requires ongoing care and lifestyle changes. It is very important that he incorporates lifestyle changes such as smoking cessation, walking regularly as tolerated, and managing blood pressure and cholesterol. Foot care is essential, and the patient will need to monitor for non-healing wounds and check his feet daily. Seek medical care if experiencing symptoms of heart attack, stroke, or extremities becoming cold, pale and numb. If he has any further concerns please let us  know.   Thanks Etheleen, FNP

## 2024-11-02 NOTE — Telephone Encounter (Signed)
 Patient notified

## 2024-11-14 NOTE — Progress Notes (Signed)
 Called patient , left voicemail to call back

## 2024-11-15 NOTE — Progress Notes (Signed)
 Called patient and left a voicemail

## 2024-11-16 NOTE — Progress Notes (Signed)
 Called patient and left a voicemail stating we are going to mail a letter regarding his lab results

## 2024-11-29 ENCOUNTER — Ambulatory Visit
